# Patient Record
Sex: Male | Born: 1945 | Race: White | Hispanic: No | Marital: Married | Smoking: Never smoker
Health system: Southern US, Community
[De-identification: ages and names within clinical notes are randomized; demographics above are authoritative.]

## PROBLEM LIST (undated history)

## (undated) DIAGNOSIS — IMO0002 Reserved for concepts with insufficient information to code with codable children: Secondary | ICD-10-CM

## (undated) DIAGNOSIS — E119 Type 2 diabetes mellitus without complications: Secondary | ICD-10-CM

## (undated) DIAGNOSIS — E785 Hyperlipidemia, unspecified: Secondary | ICD-10-CM

## (undated) DIAGNOSIS — D099 Carcinoma in situ, unspecified: Secondary | ICD-10-CM

## (undated) DIAGNOSIS — T753XXA Motion sickness, initial encounter: Secondary | ICD-10-CM

## (undated) DIAGNOSIS — G4730 Sleep apnea, unspecified: Secondary | ICD-10-CM

## (undated) DIAGNOSIS — F419 Anxiety disorder, unspecified: Secondary | ICD-10-CM

## (undated) DIAGNOSIS — E1169 Type 2 diabetes mellitus with other specified complication: Secondary | ICD-10-CM

## (undated) DIAGNOSIS — I10 Essential (primary) hypertension: Secondary | ICD-10-CM

## (undated) DIAGNOSIS — K760 Fatty (change of) liver, not elsewhere classified: Secondary | ICD-10-CM

## (undated) DIAGNOSIS — G2581 Restless legs syndrome: Secondary | ICD-10-CM

## (undated) HISTORY — PX: ANTERIOR FUSION CERVICAL SPINE: SUR626

## (undated) HISTORY — PX: FOOT FRACTURE SURGERY: SHX645

## (undated) HISTORY — PX: FRACTURE SURGERY: SHX138

## (undated) HISTORY — PX: NASAL TURBINATE REDUCTION: SHX2072

## (undated) HISTORY — PX: TONSILLECTOMY: SUR1361

## (undated) HISTORY — PX: EYE SURGERY: SHX253

---

## 2003-07-27 HISTORY — PX: TRIGGER FINGER RELEASE: SHX641

## 2015-02-25 ENCOUNTER — Other Ambulatory Visit: Payer: Self-pay | Admitting: Orthopedic Surgery

## 2015-02-25 DIAGNOSIS — M25522 Pain in left elbow: Principal | ICD-10-CM

## 2015-02-25 DIAGNOSIS — G8929 Other chronic pain: Secondary | ICD-10-CM

## 2015-03-05 ENCOUNTER — Ambulatory Visit
Admission: RE | Admit: 2015-03-05 | Discharge: 2015-03-05 | Disposition: A | Discharge status: Home / Self Care | Payer: Medicare Other | Source: Ambulatory Visit | Attending: Orthopedic Surgery | Admitting: Orthopedic Surgery

## 2015-03-05 ENCOUNTER — Ambulatory Visit
Admission: EM | Admit: 2015-03-05 | Discharge: 2015-03-05 | Disposition: A | Discharge status: Home / Self Care | Payer: Medicare Other | Attending: Family Medicine | Admitting: Family Medicine

## 2015-03-05 DIAGNOSIS — M7032 Other bursitis of elbow, left elbow: Secondary | ICD-10-CM | POA: Diagnosis not present

## 2015-03-05 DIAGNOSIS — G8929 Other chronic pain: Secondary | ICD-10-CM

## 2015-03-05 DIAGNOSIS — M7712 Lateral epicondylitis, left elbow: Secondary | ICD-10-CM | POA: Insufficient documentation

## 2015-03-05 DIAGNOSIS — S81811A Laceration without foreign body, right lower leg, initial encounter: Secondary | ICD-10-CM

## 2015-03-05 DIAGNOSIS — M25522 Pain in left elbow: Secondary | ICD-10-CM

## 2015-03-05 HISTORY — DX: Sleep apnea, unspecified: G47.30

## 2015-03-05 MED ORDER — MUPIROCIN 2 % EX OINT: 1.0000 "application " | TOPICAL_OINTMENT | Freq: Three times a day (TID) | CUTANEOUS | Status: DC

## 2015-03-05 MED ORDER — LIDOCAINE-EPINEPHRINE-TETRACAINE (LET) SOLUTION
3.0000 mL | Freq: Once | NASAL | Status: AC
  Administered 2015-03-05: 3 mL via TOPICAL

## 2015-03-05 NOTE — Discharge Instructions (Signed)
Laceration Care, Adult A laceration is a cut that goes through all layers of the skin. The cut goes into the tissue beneath the skin. HOME CARE For stitches (sutures) or staples:  Keep the cut clean and dry.  If you have a bandage (dressing), change it at least once a day. Change the bandage if it gets wet or dirty, or as told by your doctor.  Wash the cut with soap and water 2 times a day. Rinse the cut with water. Pat it dry with a clean towel.  Put a thin layer of medicated cream on the cut as told by your doctor.  You may shower after the first 24 hours. Do not soak the cut in water until the stitches are removed.  Only take medicines as told by your doctor.  Have your stitches or staples removed as told by your doctor. For skin adhesive strips:  Keep the cut clean and dry.  Do not get the strips wet. You may take a bath, but be careful to keep the cut dry.  If the cut gets wet, pat it dry with a clean towel.  The strips will fall off on their own. Do not remove the strips that are still stuck to the cut. For wound glue:  You may shower or take baths. Do not soak or scrub the cut. Do not swim. Avoid heavy sweating until the glue falls off on its own. After a shower or bath, pat the cut dry with a clean towel.  Do not put medicine on your cut until the glue falls off.  If you have a bandage, do not put tape over the glue.  Avoid lots of sunlight or tanning lamps until the glue falls off. Put sunscreen on the cut for the first year to reduce your scar.  The glue will fall off on its own. Do not pick at the glue. You may need a tetanus shot if:  You cannot remember when you had your last tetanus shot.  You have never had a tetanus shot. If you need a tetanus shot and you choose not to have one, you may get tetanus. Sickness from tetanus can be serious. GET HELP RIGHT AWAY IF:   Your pain does not get better with medicine.  Your arm, hand, leg, or foot loses feeling  (numbness) or changes color.  Your cut is bleeding.  Your joint feels weak, or you cannot use your joint.  You have painful lumps on your body.  Your cut is red, puffy (swollen), or painful.  You have a red line on the skin near the cut.  You have yellowish-white fluid (pus) coming from the cut.  You have a fever.  You have a bad smell coming from the cut or bandage.  Your cut breaks open before or after stitches are removed.  You notice something coming out of the cut, such as wood or glass.  You cannot move a finger or toe. MAKE SURE YOU:   Understand these instructions.  Will watch your condition.  Will get help right away if you are not doing well or get worse. Document Released: 12/29/2007 Document Revised: 10/04/2011 Document Reviewed: 01/05/2011 [facility] Patient Information 2015 [employer], [place]. This information is not intended to replace advice given to you by your health care provider. Make sure you discuss any questions you have with your health care provider.  Wound Care Wound care helps prevent pain and infection.  You may need a tetanus shot if:  You cannot remember when you had your last tetanus shot.  You have never had a tetanus shot.  The injury broke your skin. If you need a tetanus shot and you choose not to have one, you may get tetanus. Sickness from tetanus can be serious. HOME CARE   Only take medicine as told by your doctor.  Clean the wound daily with mild soap and water.  Change any bandages (dressings) as told by your doctor.  Put medicated cream and a bandage on the wound as told by your doctor.  Change the bandage if it gets wet, dirty, or starts to smell.  Take showers. Do not take baths, swim, or do anything that puts your wound under water.  Rest and raise (elevate) the wound until the pain and puffiness (swelling) are better.  Keep all doctor visits as told. GET HELP RIGHT AWAY IF:   Yellowish-white fluid (pus) comes from  the wound.  Medicine does not lessen your pain.  There is a red streak going away from the wound.  You have a fever. MAKE SURE YOU:   Understand these instructions.  Will watch your condition.  Will get help right away if you are not doing well or get worse. Document Released: 04/20/2008 Document Revised: 10/04/2011 Document Reviewed: 11/15/2010 [facility] Patient Information 2015 [place], [place]. This information is not intended to replace advice given to you by your health care provider. Make sure you discuss any questions you have with your health care provider.

## 2015-03-05 NOTE — ED Notes (Signed)
States chain came off chainsaw and hit right anterior shin. Approximate 1 inch laceration with bleeding controlled.

## 2015-03-05 NOTE — ED Provider Notes (Signed)
CSN: [id number]     Arrival date & time 03/05/15  1244 History   First MD Initiated Contact with Patient 03/05/15 1323     Chief Complaint  Patient presents with  . Laceration   (Consider location/radiation/quality/duration/timing/severity/associated sxs/prior Treatment) Patient is a 69 y.o. male presenting with skin laceration. The history is provided by the patient. No language interpreter was used.  Laceration Location:  Leg Leg laceration location:  R lower leg Depth:  Through underlying tissue Quality: jagged   Bleeding: controlled with pressure   Time since incident:  30 minutes Injury mechanism: chain saw broke  as he was carrying it back and cut him. Pain details:    Quality:  Throbbing and sharp   Severity:  Moderate   Timing:  Constant   Progression:  Unchanged Foreign body present:  No foreign bodies Relieved by:  Pressure Tetanus status:  Up to date   Past Medical History  Diagnosis Date  . Diabetes mellitus without complication   . Sleep apnea    Past Surgical History  Procedure Laterality Date  . Back surgery     History reviewed. No pertinent family history. Social History  Substance Use Topics  . Smoking status: Never Smoker   . Smokeless tobacco: None  . Alcohol Use: No    Review of Systems  Constitutional: Negative for fever.  Musculoskeletal: Positive for myalgias.  Skin: Positive for wound.  All other systems reviewed and are negative.   Allergies  Review of patient's allergies indicates no known allergies.  Home Medications   Prior to Admission medications   Medication Sig Start Date End Date Taking? Authorizing Provider  aspirin 81 MG tablet Take 81 mg by mouth daily.   Yes Historical Provider, MD  atorvastatin (LIPITOR) 20 MG tablet Take 20 mg by mouth daily.   Yes Historical Provider, MD  clonazePAM (KLONOPIN) 1 MG tablet Take 1 mg by mouth 2 (two) times daily.   Yes Historical Provider, MD  FLUoxetine (PROZAC) 40 MG capsule Take 40  mg by mouth daily.   Yes Historical Provider, MD  lisinopril (PRINIVIL,ZESTRIL) 20 MG tablet Take 20 mg by mouth daily.   Yes Historical Provider, MD  pioglitazone (ACTOS) 30 MG tablet Take 30 mg by mouth daily.   Yes Historical Provider, MD  pramipexole (MIRAPEX) 1 MG tablet Take 1 mg by mouth 2 (two) times daily.   Yes Historical Provider, MD  sitaGLIPtin (JANUVIA) 25 MG tablet Take 25 mg by mouth 2 (two) times daily.   Yes Historical Provider, MD  mupirocin ointment (BACTROBAN) 2 % Apply 1 application topically 3 (three) times daily. 03/05/15   Hassan Rowan, MD   BP 91/52 mmHg  Pulse 79  Temp(Src) 98 F (36.7 C) (Tympanic)  Resp 16  Ht 5\' 9"  (1.753 m)  Wt 195 lb (88.451 kg)  BMI 28.78 kg/m2  SpO2 96% Physical Exam  Constitutional: He is oriented to person, place, and time. He appears well-developed and well-nourished.  HENT:  Head: Normocephalic.  Musculoskeletal: He exhibits tenderness. He exhibits no edema.       Right lower leg: He exhibits laceration.       Legs: 5cm jagged laceration on lower R leg  Neurological: He is alert and oriented to person, place, and time.  Skin: Skin is warm. No rash noted.  Psychiatric: He has a normal mood and affect. His behavior is normal.  Vitals reviewed.   ED Course  LACERATION REPAIR Date/Time: 03/05/2015 2:04 PM Performed by: Hassan Rowan  Authorized by: Hassan Rowan Consent: Verbal consent obtained. Written consent obtained. Consent given by: patient Patient understanding: patient states understanding of the procedure being performed Body area: lower extremity Location details: right lower leg Laceration length: 5 cm Contamination: The wound is contaminated. Foreign bodies: no foreign bodies Tendon involvement: none Nerve involvement: none Vascular damage: yes Local anesthetic: lidocaine 1% without epinephrine Anesthetic total (ml): 3. Amount of cleaning: standard Debridement: minimal Skin closure: staples Number of sutures:  5. Dressing: pressure dressing   (including critical care time) Labs Review Labs Reviewed - No data to display  Imaging Review Mr Elbow Left Wo Contrast  03/05/2015   CLINICAL DATA:  Constant pain and soreness in the left elbow. Symptoms are chronic. Initial encounter.  EXAM: MRI OF THE LEFT ELBOW WITHOUT CONTRAST  TECHNIQUE: Multiplanar, multisequence MR imaging of the elbow was performed. No intravenous contrast was administered.  COMPARISON:  None.  FINDINGS: TENDONS  Common forearm flexor origin: Intact.  Common forearm extensor origin: Intrasubstance increased T2 signal is seen in the tendons of the common extensor origin consistent with epicondylitis. No frank tear is identified.  Biceps: Prominent fluid is seen in the bicipitoradial bursa consistent with bursitis. The distal most biceps tendon appears slightly attenuated with mildly increased intrasubstance increased T2 signal consistent with tendinosis with without tear.  Triceps: Intact.  LIGAMENTS  Medial stabilizers: Intact.  Lateral stabilizers: Intrasubstance increased T2 signal is seen in the proximal fibers of the lateral ulnar collateral ligament consistent with sprain without tear. Lateral ligaments are otherwise intact.  Cartilage: Unremarkable.  Joint: No joint effusion.  Cubital tunnel: Appears normal.  Bones: Normal marrow signal throughout.  IMPRESSION: The study is positive for bicipitoradial bursitis. Tendinosis of the distal most biceps tendon without tear is also identified.  Lateral epicondylitis with associated sprain of the lateral ulnar collateral ligament without frank tear.   Electronically Signed   By: Drusilla Kanner M.D.   On: 03/05/2015 12:48     MDM   1. Laceration of leg excluding thigh, right, initial encounter     Patient tolerated repair well. Bactroban ointment ordered. Instructed to return in 14 days for staple removal.   Hassan Rowan, MD 03/05/15 1409

## 2015-03-18 ENCOUNTER — Ambulatory Visit
Admission: EM | Admit: 2015-03-18 | Discharge: 2015-03-18 | Disposition: A | Discharge status: Home / Self Care | Payer: Medicare Other

## 2015-03-18 NOTE — ED Notes (Signed)
Pt. Seen here at [facility] on 03/05/15 and had 5 sutures placed right lower anterior shin. Wound well healed. For staple removal

## 2015-03-18 NOTE — ED Notes (Signed)
5 staples removed without difficulty. Steri strips applied

## 2015-05-23 ENCOUNTER — Ambulatory Visit
Admission: RE | Admit: 2015-05-23 | Discharge: 2015-05-23 | Disposition: A | Discharge status: Home / Self Care | Payer: Medicare Other | Source: Ambulatory Visit | Attending: Unknown Physician Specialty | Admitting: Unknown Physician Specialty

## 2015-05-23 ENCOUNTER — Ambulatory Visit: Payer: Medicare Other | Admitting: Student in an Organized Health Care Education/Training Program

## 2015-05-23 ENCOUNTER — Encounter
Admission: RE | Disposition: A | Discharge status: Home / Self Care | Payer: Self-pay | Source: Ambulatory Visit | Attending: Unknown Physician Specialty

## 2015-05-23 ENCOUNTER — Encounter: Payer: Self-pay | Admitting: *Deleted

## 2015-05-23 DIAGNOSIS — Z79899 Other long term (current) drug therapy: Secondary | ICD-10-CM | POA: Insufficient documentation

## 2015-05-23 DIAGNOSIS — Z7982 Long term (current) use of aspirin: Secondary | ICD-10-CM | POA: Insufficient documentation

## 2015-05-23 DIAGNOSIS — M25722 Osteophyte, left elbow: Secondary | ICD-10-CM | POA: Insufficient documentation

## 2015-05-23 DIAGNOSIS — Z8249 Family history of ischemic heart disease and other diseases of the circulatory system: Secondary | ICD-10-CM | POA: Diagnosis not present

## 2015-05-23 DIAGNOSIS — E119 Type 2 diabetes mellitus without complications: Secondary | ICD-10-CM | POA: Insufficient documentation

## 2015-05-23 DIAGNOSIS — M7712 Lateral epicondylitis, left elbow: Secondary | ICD-10-CM | POA: Diagnosis not present

## 2015-05-23 DIAGNOSIS — G4730 Sleep apnea, unspecified: Secondary | ICD-10-CM | POA: Insufficient documentation

## 2015-05-23 DIAGNOSIS — F419 Anxiety disorder, unspecified: Secondary | ICD-10-CM | POA: Insufficient documentation

## 2015-05-23 DIAGNOSIS — G2581 Restless legs syndrome: Secondary | ICD-10-CM | POA: Insufficient documentation

## 2015-05-23 DIAGNOSIS — M1990 Unspecified osteoarthritis, unspecified site: Secondary | ICD-10-CM | POA: Insufficient documentation

## 2015-05-23 DIAGNOSIS — E785 Hyperlipidemia, unspecified: Secondary | ICD-10-CM | POA: Diagnosis not present

## 2015-05-23 HISTORY — DX: Anxiety disorder, unspecified: F41.9

## 2015-05-23 HISTORY — DX: Restless legs syndrome: G25.81

## 2015-05-23 HISTORY — DX: Motion sickness, initial encounter: T75.3XXA

## 2015-05-23 HISTORY — DX: Reserved for concepts with insufficient information to code with codable children: IMO0002

## 2015-05-23 HISTORY — DX: Hyperlipidemia, unspecified: E78.5

## 2015-05-23 HISTORY — PX: TENNIS ELBOW RELEASE/NIRSCHEL PROCEDURE: SHX6651

## 2015-05-23 LAB — GLUCOSE, CAPILLARY
GLUCOSE-CAPILLARY: 95 mg/dL (ref 65–99)
Glucose-Capillary: 120 mg/dL — ABNORMAL HIGH (ref 65–99)

## 2015-05-23 SURGERY — TENNIS ELBOW RELEASE/NIRSCHEL PROCEDURE
Anesthesia: Monitor Anesthesia Care | Laterality: Left | Wound class: Clean

## 2015-05-23 MED ORDER — LIDOCAINE HCL (CARDIAC) 20 MG/ML IV SOLN
INTRAVENOUS | Status: DC | PRN
  Administered 2015-05-23: 40 mg via INTRAVENOUS

## 2015-05-23 MED ORDER — LACTATED RINGERS IV SOLN
INTRAVENOUS | Status: DC
  Administered 2015-05-23: 08:00:00 via INTRAVENOUS

## 2015-05-23 MED ORDER — BUPIVACAINE HCL (PF) 0.5 % IJ SOLN
INTRAMUSCULAR | Status: DC | PRN
  Administered 2015-05-23: 10 mL

## 2015-05-23 MED ORDER — FENTANYL CITRATE (PF) 100 MCG/2ML IJ SOLN
25.0000 ug | INTRAMUSCULAR | Status: DC | PRN
Start: 2015-05-23 — End: 2015-05-23

## 2015-05-23 MED ORDER — ROPIVACAINE HCL 5 MG/ML IJ SOLN
INTRAMUSCULAR | Status: DC | PRN
  Administered 2015-05-23: 35 mL via PERINEURAL

## 2015-05-23 MED ORDER — PROPOFOL 500 MG/50ML IV EMUL
INTRAVENOUS | Status: DC | PRN
  Administered 2015-05-23: 50 ug/kg/min via INTRAVENOUS

## 2015-05-23 MED ORDER — FENTANYL CITRATE (PF) 100 MCG/2ML IJ SOLN
INTRAMUSCULAR | Status: DC | PRN
  Administered 2015-05-23: 50 ug via INTRAVENOUS

## 2015-05-23 MED ORDER — HYDROCODONE-ACETAMINOPHEN 5-325 MG PO TABS: 1.0000 | ORAL_TABLET | ORAL | Status: DC | PRN

## 2015-05-23 MED ORDER — MIDAZOLAM HCL 2 MG/2ML IJ SOLN
INTRAMUSCULAR | Status: DC | PRN
  Administered 2015-05-23: 2 mg via INTRAVENOUS

## 2015-05-23 MED ORDER — DEXAMETHASONE SODIUM PHOSPHATE 4 MG/ML IJ SOLN
INTRAMUSCULAR | Status: DC | PRN
  Administered 2015-05-23: 4 mg via PERINEURAL

## 2015-05-23 MED ORDER — ONDANSETRON HCL 4 MG/2ML IJ SOLN: 4.0000 mg | Freq: Once | INTRAMUSCULAR | Status: DC | PRN

## 2015-05-23 SURGICAL SUPPLY — 40 items
BANDAGE ELASTIC 4 CLIP ST LF (GAUZE/BANDAGES/DRESSINGS) ×4 IMPLANT
BIT DRILL 2.0 (BIT) ×1
BIT DRILL 2XNS DISP SS SM FRAG (BIT) ×1 IMPLANT
BIT DRL 2XNS DISP SS SM FRAG (BIT) ×1
BLADE SURG SZ10 CARB STEEL (BLADE) ×2 IMPLANT
BNDG COHESIVE 4X5 TAN STRL (GAUZE/BANDAGES/DRESSINGS) ×2 IMPLANT
BNDG ESMARK 4X12 TAN STRL LF (GAUZE/BANDAGES/DRESSINGS) ×2 IMPLANT
BUR RND CARBIDE 4.0 (BURR) ×2 IMPLANT
CANISTER SUCT 1200ML W/VALVE (MISCELLANEOUS) ×2 IMPLANT
CUFF TOURN SGL QUICK 18 (TOURNIQUET CUFF) ×2 IMPLANT
CUFF TOURN SGL QUICK 24 (TOURNIQUET CUFF)
CUFF TRNQT CYL 24X4X40X1 (TOURNIQUET CUFF) IMPLANT
DRAPE INCISE IOBAN 66X45 STRL (DRAPES) ×2 IMPLANT
DURAPREP 26ML APPLICATOR (WOUND CARE) ×2 IMPLANT
GAUZE SPONGE 4X4 12PLY STRL (GAUZE/BANDAGES/DRESSINGS) ×2 IMPLANT
GLOVE BIO SURGEON STRL SZ7.5 (GLOVE) ×2 IMPLANT
GLOVE BIO SURGEON STRL SZ8 (GLOVE) ×4 IMPLANT
GLOVE INDICATOR 8.0 STRL GRN (GLOVE) ×2 IMPLANT
GOWN STRL REUS W/ TWL LRG LVL3 (GOWN DISPOSABLE) ×2 IMPLANT
GOWN STRL REUS W/TWL LRG LVL3 (GOWN DISPOSABLE) ×2
KIT SHOULDER TRACTION (DRAPES) ×2 IMPLANT
NS IRRIG 500ML POUR BTL (IV SOLUTION) ×2 IMPLANT
PACK EXTREMITY ARMC (MISCELLANEOUS) ×2 IMPLANT
PAD CAST CTTN 4X4 STRL (SOFTGOODS) ×1 IMPLANT
PAD GROUND ADULT SPLIT (MISCELLANEOUS) ×2 IMPLANT
PADDING CAST COTTON 4X4 STRL (SOFTGOODS) ×1
SCRUB POVIDONE IODINE 4 OZ (MISCELLANEOUS) ×2 IMPLANT
SLING ARM LRG DEEP (SOFTGOODS) ×2 IMPLANT
SPLINT FAST PLASTER 5X30 (CAST SUPPLIES) ×1
SPLINT PLASTER CAST FAST 5X30 (CAST SUPPLIES) ×1 IMPLANT
SPONGE LAP 18X18 5 PK (GAUZE/BANDAGES/DRESSINGS) IMPLANT
STAPLER SKIN PROX 35W (STAPLE) IMPLANT
STOCKINETTE IMPERVIOUS LG (DRAPES) ×2 IMPLANT
STRAP BODY AND KNEE 60X3 (MISCELLANEOUS) ×2 IMPLANT
SUT VIC AB 0 CT1 27 (SUTURE)
SUT VIC AB 0 CT1 27XCR 8 STRN (SUTURE) IMPLANT
SUT VIC AB 4-0 SH 27 (SUTURE) ×1
SUT VIC AB 4-0 SH 27XANBCTRL (SUTURE) ×1 IMPLANT
SUT VICRYL+ 3-0 36IN CT-1 (SUTURE) ×4 IMPLANT
SYRINGE 10CC LL (SYRINGE) ×2 IMPLANT

## 2015-05-23 NOTE — Transfer of Care (Signed)
Immediate Anesthesia Transfer of Care Note  Patient: Alexander BreslowGary W Kreager  Procedure(s) Performed: Procedure(s) with comments: TENNIS ELBOW RELEASE/NIRSCHEL PROCEDURE (Left) - CPAP AND DIABETIC  Patient Location: PACU  Anesthesia Type: Regional, MAC  Level of Consciousness: awake, alert  and patient cooperative  Airway and Oxygen Therapy: Patient Spontanous Breathing and Patient connected to supplemental oxygen  Post-op Assessment: Post-op Vital signs reviewed, Patient's Cardiovascular Status Stable, Respiratory Function Stable, Patent Airway and No signs of Nausea or vomiting  Post-op Vital Signs: Reviewed and stable  Complications: No apparent anesthesia complications

## 2015-05-23 NOTE — Op Note (Signed)
05/23/2015  12:03 PM  PATIENT:  Alexander BreslowGary W Hart  69 y.o. 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 He been refractory to injection of the left lateral epicondyle with steroid and anesthetic. An MRI was obtained which revealed disruption of the extensor carpi radialis brevis attachment to the left lateral epicondyle. Patient was ultimately scheduled for a Nirschl procedure on his left lateral epicondyle.  OP NOTE: The patient had a supraclavicular block in the preop area. He was then taken to the operating room where tourniquet was applied on his left upper extremity. The left upper extremity was then prepped and draped in usual fashion for procedure about the elbow. The left upper extremity was then exsanguinated and the tourniquet inflated. About a 1-1/2 inch longitudinal incision was made over the left lateral epicondyle and radial head. Dissection was carried down through subcutaneous tissue onto the interval between the common extensor and the extensor radialis longus. This was divided exposing the radio-humeral joint. I then reflected the common extensor attachment off of the lateral epicondyle. Inspection of the radio- humeral joint failed to reveal any intra-articular abnormality. I did go ahead and resect the remaining fibrous attachment of the extensor carpi radialis brevis. This was resected in a V shaped fashion. I then used a small round power bur to lightly decorticate the lateral epicondyle. There were several osteophytes that were removed at this time. I then used a 2 mm drill to  drill multiple vent holes in the lateral epicondyle  I then irrigated the wound with saline. The i longitudinal incision in the extensor mechanism was then repaired using 2-0 Vicryl sutures. The tourniquet was released at this time. It was up for 24 minutes. There was minimal bleeding. The subcutaneous tissue was closed with 3-0 Vicryl and the skin with skin staples. I did inject the wound with about 10 cc of half percent Marcaine without epinephrine. A sterile dressing was applied along with a fiberglass posterior splint with the elbow flexed about 90. A sling was applied to patient's left upper extremity. The patient was then transferred to a stretcher bed and taken to the recovery room in satisfactory condition.

## 2015-05-23 NOTE — Discharge Instructions (Signed)
General Anesthesia, Adult, Care After Refer to this sheet in the next few weeks. These instructions provide you with information on caring for yourself after your procedure. Your health care provider may also give you more specific instructions. Your treatment has been planned according to current medical practices, but problems sometimes occur. Call your health care provider if you have any problems or questions after your procedure. WHAT TO EXPECT AFTER THE PROCEDURE After the procedure, it is typical to experience:  Sleepiness.  Nausea and vomiting. HOME CARE INSTRUCTIONS  For the first 24 hours after general anesthesia:  Have a responsible person with you.  Do not drive a car. If you are alone, do not take public transportation.  Do not drink alcohol.  Do not take medicine that has not been prescribed by your health care provider.  Do not sign important papers or make important decisions.  You may resume a normal diet and activities as directed by your health care provider.  Change bandages (dressings) as directed.  If you have questions or problems that seem related to general anesthesia, call the hospital and ask for the anesthetist or anesthesiologist on call. SEEK MEDICAL CARE IF:  You have nausea and vomiting that continue the day after anesthesia.  You develop a rash. SEEK IMMEDIATE MEDICAL CARE IF:   You have difficulty breathing.  You have chest pain.  You have any allergic problems.   This information is not intended to replace advice given to you by your health care provider. Make sure you discuss any questions you have with your health care provider.   Document Released: 10/18/2000 Document Revised: 08/02/2014 Document Reviewed: 11/10/2011 Elsevier Interactive Patient Education 2016 [employer]  Elevation  Ice pack  RTC in about 10 days  Use sling as needed

## 2015-05-23 NOTE — Anesthesia Procedure Notes (Addendum)
Anesthesia Regional Block:  Supraclavicular block  Pre-Anesthetic Checklist: ,, timeout performed, Correct Patient, Correct Site, Correct Laterality, Correct Procedure, Correct Position, site marked, Risks and benefits discussed,  Surgical consent,  Pre-op evaluation,  At surgeon's request and post-op pain management  Laterality: Left  Prep: chloraprep       Needles:  Injection technique: Single-shot  Needle Type: Echogenic Stimulator Needle      Needle Gauge: 21 and 21 G    Additional Needles:  Procedures: ultrasound guided (picture in chart) and nerve stimulator Supraclavicular block  Nerve Stimulator or Paresthesia:  Response: bicep contraction, 0.45 mA,   Additional Responses:   Narrative:  Start time: 05/23/2015 8:33 AM End time: 05/23/2015 8:38 AM Injection made incrementally with aspirations every 5 mL.  Performed by: Personally  Anesthesiologist: Ranee GosselinSTELLA, MICHAEL  Additional Notes: Functioning IV was confirmed and monitors applied.  Sterile prep and drape,hand hygiene and sterile gloves were used.Ultrasound guidance: relevant anatomy identified, needle position confirmed, local anesthetic spread visualized around nerve(s)., vascular puncture avoided.  Image printed for medical record.  Negative aspiration and negative test dose prior to incremental administration of local anesthetic. The patient tolerated the procedure well. Vitals signes recorded in RN notes.   Procedure Name: MAC Performed by: Andee PolesBUSH, Joi Leyva Pre-anesthesia Checklist: Patient identified, Emergency Drugs available, Suction available, Timeout performed and Patient being monitored Patient Re-evaluated:Patient Re-evaluated prior to inductionOxygen Delivery Method: Nasal cannula Placement Confirmation: positive ETCO2

## 2015-05-23 NOTE — H&P (Signed)
  H and P reviewed. No changes. Uploaded at later date. 

## 2015-05-23 NOTE — Anesthesia Preprocedure Evaluation (Signed)
Anesthesia Evaluation  Patient identified by MRN, date of birth, ID band  Reviewed: Allergy & Precautions, H&P , NPO status , Patient's Chart, lab work & pertinent test results  Airway Mallampati: II  TM Distance: >3 FB Neck ROM: full    Dental no notable dental hx.    Pulmonary sleep apnea and Continuous Positive Airway Pressure Ventilation ,    Pulmonary exam normal        Cardiovascular  Rhythm:regular Rate:Normal     Neuro/Psych    GI/Hepatic   Endo/Other  diabetes, Type 2, Oral Hypoglycemic Agents  Renal/GU      Musculoskeletal   Abdominal   Peds  Hematology   Anesthesia Other Findings S/p uvpp  Reproductive/Obstetrics                             Anesthesia Physical Anesthesia Plan  ASA: II  Anesthesia Plan: Regional and MAC   Post-op Pain Management:    Induction:   Airway Management Planned:   Additional Equipment:   Intra-op Plan:   Post-operative Plan:   Informed Consent: I have reviewed the patients History and Physical, chart, labs and discussed the procedure including the risks, benefits and alternatives for the proposed anesthesia with the patient or authorized representative who has indicated his/her understanding and acceptance.     Plan Discussed with: CRNA  Anesthesia Plan Comments:         Anesthesia Quick Evaluation

## 2015-05-23 NOTE — Anesthesia Postprocedure Evaluation (Signed)
  Anesthesia Post-op Note  Patient: Alexander Hart  Procedure(s) Performed: Procedure(s) with comments: TENNIS ELBOW RELEASE/NIRSCHEL PROCEDURE (Left) - CPAP AND DIABETIC  Anesthesia type:Regional, MAC  Patient location: PACU  Post pain: Pain level controlled  Post assessment: Post-op Vital signs reviewed, Patient's Cardiovascular Status Stable, Respiratory Function Stable, Patent Airway and No signs of Nausea or vomiting  Post vital signs: Reviewed and stable  Last Vitals:  Filed Vitals:   05/23/15 1050  BP:   Pulse: 51  Temp:   Resp: 15    Level of consciousness: awake, alert  and patient cooperative  Complications: No apparent anesthesia complications

## 2015-05-23 NOTE — Progress Notes (Signed)
Assisted Mike Stella ANMD with left, ultrasound guided, supraclavicular block. Side rails up, monitors on throughout procedure. See vital signs in flow sheet. Tolerated Procedure well.  

## 2015-05-26 ENCOUNTER — Encounter: Payer: Self-pay | Admitting: Unknown Physician Specialty

## 2015-08-06 ENCOUNTER — Ambulatory Visit
Admission: EM | Admit: 2015-08-06 | Discharge: 2015-08-06 | Disposition: A | Discharge status: Other Acute Inpatient Hospital | Payer: Medicare Other | Attending: Family Medicine | Admitting: Family Medicine

## 2015-08-06 DIAGNOSIS — M20012 Mallet finger of left finger(s): Secondary | ICD-10-CM | POA: Diagnosis not present

## 2015-08-06 DIAGNOSIS — S6992XA Unspecified injury of left wrist, hand and finger(s), initial encounter: Secondary | ICD-10-CM

## 2015-08-06 NOTE — ED Provider Notes (Addendum)
Patient presents today after cutting his left digits with a table saw. Patient states that it happened about 10 or 15 minutes ago. Patient has injury to his left first and second and third digits. He denies any other injury anywhere else. Patient denies being on any other blood thinners besides a baby aspirin daily.  ROS: Negative except mentioned above.  GENERAL: NAD MSK: L Hand-  Patient has small distal amputations of the skin/tissue of the first and second digits that are bleeding, third digit appears to have a laceration on the dorsal aspect of the DIP area, patient is unable to extend fully at this area, neurovascularly intact   A/P: Left finger injury- given inability to extend the digit at the DIP with a laceration there would recommend that patient go to the ER. X-rays and ortho referral will be needed. Patient states that he will go there on his own now than her stated dress the area prior to the patient being discharged.   Jolene ProvostKirtida Makenzey Nanni, MD 08/06/15 [phone]  Jolene ProvostKirtida Kristofor Michalowski, MD 08/06/15 [phone]

## 2015-08-06 NOTE — ED Notes (Signed)
Patient states he cut his left thumb , and index finger with a table saw

## 2016-01-06 ENCOUNTER — Encounter: Payer: Self-pay | Admitting: Emergency Medicine

## 2016-01-06 ENCOUNTER — Ambulatory Visit
Admission: EM | Admit: 2016-01-06 | Discharge: 2016-01-06 | Disposition: A | Discharge status: Home / Self Care | Payer: Medicare Other | Attending: Internal Medicine | Admitting: Internal Medicine

## 2016-01-06 DIAGNOSIS — J209 Acute bronchitis, unspecified: Secondary | ICD-10-CM

## 2016-01-06 DIAGNOSIS — H6692 Otitis media, unspecified, left ear: Secondary | ICD-10-CM | POA: Diagnosis not present

## 2016-01-06 DIAGNOSIS — J208 Acute bronchitis due to other specified organisms: Secondary | ICD-10-CM

## 2016-01-06 MED ORDER — ALBUTEROL SULFATE HFA 108 (90 BASE) MCG/ACT IN AERS: 1.0000 | INHALATION_SPRAY | Freq: Four times a day (QID) | RESPIRATORY_TRACT | Status: DC | PRN

## 2016-01-06 MED ORDER — IPRATROPIUM-ALBUTEROL 0.5-2.5 (3) MG/3ML IN SOLN
3.0000 mL | Freq: Once | RESPIRATORY_TRACT | Status: AC
  Administered 2016-01-06: 3 mL via RESPIRATORY_TRACT

## 2016-01-06 MED ORDER — AMOXICILLIN-POT CLAVULANATE 875-125 MG PO TABS: 1.0000 | ORAL_TABLET | Freq: Two times a day (BID) | ORAL | Status: DC

## 2016-01-06 NOTE — ED Notes (Signed)
Patient states he feels bad all over and has a bad cough that started last Friday

## 2016-01-06 NOTE — Discharge Instructions (Signed)
Anticipate gradual improvement over the next several days; cough may linger for a couple weeks. Recheck or followup pcp/Dr Jannet AskewSantiana for increasing phlegm production, new fever >100.5, or if not starting to improve in a few days.

## 2016-01-06 NOTE — ED Provider Notes (Signed)
CSN: [id number]     Arrival date & time 01/06/16  1313 History   None    Chief Complaint  Patient presents with  . Cough   HPI  70 year old patient who presents today with 3 day history of malaise, fatigue, head and chest congestion. Lots of sinus drainage. No fever, some headache. Particularly, had right orbital pressure and headache this morning, that has improved after being up for several hours.  Cough is productive of scant phlegm, wheezy cough, feels like a little more work to take a deep breath. Coughing spells. At onset of symptoms, he had several episodes of emesis, and one episode of diarrhea. Still nauseous, poor appetite. Not eating well. No abdominal pain No fever. No sore throat. No rash. Did remove a tick from himself about a week ago, it was flat and small, not engorged.  Had some lightheadedness yesterday, tried to do some work in his shop, and was too dizzy. Feels a little less dizzy today.  Past Medical History  Diagnosis Date  . Diabetes mellitus without complication (HCC)     TYPE 2  . Sleep apnea     S/P T&A AND SINUS SURGERY, USES CPAP  . Hyperlipidemia   . Restless leg syndrome   . Anxiety     CONTROLLED ON PROZAC  . Epicondylitis syndrome of elbow     LEFT, ARTHRITIS ANKLES,SHOULDERS,KNEES  . Motion sickness     CAR   Past Surgical History  Procedure Laterality Date  . Foot fracture surgery Left   . Trigger finger release Bilateral 2005  . Nasal turbinate reduction    . Tonsillectomy    . Anterior fusion cervical spine      C5-6  . Tennis elbow release/nirschel procedure Left 05/23/2015    Procedure: TENNIS ELBOW RELEASE/NIRSCHEL PROCEDURE;  Surgeon: Erin SonsHarold Kernodle, MD;  Location[facility];  Service: Orthopedics;  Laterality: Left;  CPAP AND DIABETIC   History reviewed. No pertinent family history. Social History  Substance Use Topics  . Smoking status: Never Smoker   . Smokeless tobacco: Never Used  . Alcohol Use: Yes     Comment: 1/  MONTH WINE    Review of Systems  All other systems reviewed and are negative.   Allergies  Review of patient's allergies indicates no known allergies.  Home Medications   Prior to Admission medications   Medication Sig Start Date End Date Taking? Authorizing Provider  aspirin 81 MG tablet Take 81 mg by mouth daily. AM   Yes Historical Provider, MD  atorvastatin (LIPITOR) 20 MG tablet Take 20 mg by mouth daily. AM   Yes Historical Provider, MD  FLUoxetine (PROZAC) 40 MG capsule Take 40 mg by mouth daily. AM   Yes Historical Provider, MD  Linagliptin-Metformin HCl 2.11-998 MG TABS Take by mouth 2 (two) times daily. AM AND PM   Yes Historical Provider, MD  lisinopril (PRINIVIL,ZESTRIL) 20 MG tablet Take 20 mg by mouth daily.   Yes Historical Provider, MD  pioglitazone (ACTOS) 30 MG tablet Take 30 mg by mouth daily. AM   Yes Historical Provider, MD  pramipexole (MIRAPEX) 1 MG tablet Take 2 mg by mouth daily. PM   Yes Historical Provider, MD  clonazePAM (KLONOPIN) 1 MG tablet Take 1 mg by mouth as needed.     Historical Provider, MD   Meds Ordered and Administered this Visit   Medications  ipratropium-albuterol (DUONEB) 0.5-2.5 (3) MG/3ML nebulizer solution 3 mL (3 mLs Nebulization Given 01/06/16 1438)  BP 95/58 mmHg  Pulse 76  Temp(Src) 97.6 F (36.4 C)  Resp 18  Wt 195 lb (88.451 kg)  SpO2 98% Orthostatic VS for the past 24 hrs:  BP- Lying Pulse- Lying BP- Sitting Pulse- Sitting BP- Standing at 0 minutes Pulse- Standing at 0 minutes  01/06/16 1453 100/59 mmHg 75 101/61 mmHg 77 93/56 mmHg 85   Physical Exam  Constitutional: He is oriented to person, place, and time. No distress.  Alert, nicely groomed Sitting up in chair. Looks tired.  HENT:  Head: Atraumatic.  Bilateral TMs dull, left is opaque and red. Moderate nasal congestion Throat is pink, scant postnasal drainage, oral mucosa is dry  Eyes:  Conjugate gaze, no eye redness/drainage  Neck: Neck supple.   Cardiovascular: Normal rate and regular rhythm.   Pulmonary/Chest: No respiratory distress. He has no wheezes. He has no rales.  Coarse and somewhat diminished but symmetric breath sounds throughout  Abdominal: He exhibits no distension.  Musculoskeletal: Normal range of motion. He exhibits no edema.  Neurological: He is alert and oriented to person, place, and time.  Skin: Skin is warm and dry. No rash noted. No pallor.  Tanned. No cyanosis  Nursing note and vitals reviewed.   ED Course  Procedures (including critical care time)   Dr Terrance [facility]    MDM   1. Infective left otitis media   2. Acute bronchitis due to infection    Meds ordered this encounter  Medications  . amoxicillin-clavulanate (AUGMENTIN) 875-125 MG tablet    Sig: Take 1 tablet by mouth every 12 (twelve) hours.    Dispense:  14 tablet    Refill:  0  . albuterol (PROVENTIL HFA;VENTOLIN HFA) 108 (90 Base) MCG/ACT inhaler    Sig: Inhale 1-2 puffs into the lungs every 6 (six) hours as needed for wheezing or shortness of breath.    Dispense:  1 Inhaler    Refill:  0   Anticipate gradual improvement over the next several days; cough may linger for a couple weeks. Recheck or followup pcp/Dr Jannet Askew for increasing phlegm production, new fever >100.5, or if not starting to improve in a few days    Eustace Moore, MD 01/14/16 1655

## 2016-01-19 ENCOUNTER — Ambulatory Visit: Payer: Medicare Other

## 2016-01-19 ENCOUNTER — Ambulatory Visit
Admission: EM | Admit: 2016-01-19 | Discharge: 2016-01-19 | Disposition: A | Discharge status: Home / Self Care | Payer: Medicare Other | Attending: Family Medicine | Admitting: Family Medicine

## 2016-01-19 ENCOUNTER — Encounter: Payer: Self-pay | Admitting: *Deleted

## 2016-01-19 DIAGNOSIS — E785 Hyperlipidemia, unspecified: Secondary | ICD-10-CM | POA: Diagnosis not present

## 2016-01-19 DIAGNOSIS — M1990 Unspecified osteoarthritis, unspecified site: Secondary | ICD-10-CM | POA: Diagnosis not present

## 2016-01-19 DIAGNOSIS — E119 Type 2 diabetes mellitus without complications: Secondary | ICD-10-CM | POA: Diagnosis not present

## 2016-01-19 DIAGNOSIS — Z79899 Other long term (current) drug therapy: Secondary | ICD-10-CM | POA: Diagnosis not present

## 2016-01-19 DIAGNOSIS — G2581 Restless legs syndrome: Secondary | ICD-10-CM | POA: Diagnosis not present

## 2016-01-19 DIAGNOSIS — J40 Bronchitis, not specified as acute or chronic: Secondary | ICD-10-CM | POA: Diagnosis not present

## 2016-01-19 DIAGNOSIS — Z7982 Long term (current) use of aspirin: Secondary | ICD-10-CM | POA: Insufficient documentation

## 2016-01-19 DIAGNOSIS — F419 Anxiety disorder, unspecified: Secondary | ICD-10-CM | POA: Insufficient documentation

## 2016-01-19 DIAGNOSIS — R05 Cough: Secondary | ICD-10-CM | POA: Diagnosis present

## 2016-01-19 DIAGNOSIS — R51 Headache: Secondary | ICD-10-CM | POA: Diagnosis present

## 2016-01-19 DIAGNOSIS — G4730 Sleep apnea, unspecified: Secondary | ICD-10-CM | POA: Insufficient documentation

## 2016-01-19 DIAGNOSIS — R0981 Nasal congestion: Secondary | ICD-10-CM | POA: Diagnosis present

## 2016-01-19 MED ORDER — DOXYCYCLINE HYCLATE 100 MG PO TABS: 100.0000 mg | ORAL_TABLET | Freq: Two times a day (BID) | ORAL | Status: DC

## 2016-01-19 MED ORDER — ALBUTEROL SULFATE HFA 108 (90 BASE) MCG/ACT IN AERS: 1.0000 | INHALATION_SPRAY | Freq: Four times a day (QID) | RESPIRATORY_TRACT | Status: DC | PRN

## 2016-01-19 MED ORDER — PREDNISONE 20 MG PO TABS: ORAL_TABLET | ORAL | Status: DC

## 2016-01-19 MED ORDER — IPRATROPIUM-ALBUTEROL 0.5-2.5 (3) MG/3ML IN SOLN
3.0000 mL | Freq: Once | RESPIRATORY_TRACT | Status: AC
  Administered 2016-01-19: 3 mL via RESPIRATORY_TRACT

## 2016-01-19 NOTE — Discharge Instructions (Signed)
Upper Respiratory Infection, Adult Most upper respiratory infections (URIs) are a viral infection of the air passages leading to the lungs. A URI affects the nose, throat, and upper air passages. The most common type of URI is nasopharyngitis and is typically referred to as "the common cold." URIs run their course and usually go away on their own. Most of the time, a URI does not require medical attention, but sometimes a bacterial infection in the upper airways can follow a viral infection. This is called a secondary infection. Sinus and middle ear infections are common types of secondary upper respiratory infections. Bacterial pneumonia can also complicate a URI. A URI can worsen asthma and chronic obstructive pulmonary disease (COPD). Sometimes, these complications can require emergency medical care and may be life threatening.  CAUSES Almost all URIs are caused by viruses. A virus is a type of germ and can spread from one person to another.  RISKS FACTORS You may be at risk for a URI if:   You smoke.   You have chronic heart or lung disease.  You have a weakened defense (immune) system.   You are very young or very old.   You have nasal allergies or asthma.  You work in crowded or poorly ventilated areas.  You work in health care facilities or schools. SIGNS AND SYMPTOMS  Symptoms typically develop 2-3 days after you come in contact with a cold virus. Most viral URIs last 7-10 days. However, viral URIs from the influenza virus (flu virus) can last 14-18 days and are typically more severe. Symptoms may include:   Runny or stuffy (congested) nose.   Sneezing.   Cough.   Sore throat.   Headache.   Fatigue.   Fever.   Loss of appetite.   Pain in your forehead, behind your eyes, and over your cheekbones (sinus pain).  Muscle aches.  DIAGNOSIS  Your health care provider may diagnose a URI by:  Physical exam.  Tests to check that your symptoms are not due to  another condition such as:  Strep throat.  Sinusitis.  Pneumonia.  Asthma. TREATMENT  A URI goes away on its own with time. It cannot be cured with medicines, but medicines may be prescribed or recommended to relieve symptoms. Medicines may help:  Reduce your fever.  Reduce your cough.  Relieve nasal congestion. HOME CARE INSTRUCTIONS   Take medicines only as directed by your health care provider.   Gargle warm saltwater or take cough drops to comfort your throat as directed by your health care provider.  Use a warm mist humidifier or inhale steam from a shower to increase air moisture. This may make it easier to breathe.  Drink enough fluid to keep your urine clear or pale yellow.   Eat soups and other clear broths and maintain good nutrition.   Rest as needed.   Return to work when your temperature has returned to normal or as your health care provider advises. You may need to stay home longer to avoid infecting others. You can also use a face mask and careful hand washing to prevent spread of the virus.  Increase the usage of your inhaler if you have asthma.   Do not use any tobacco products, including cigarettes, chewing tobacco, or electronic cigarettes. If you need help quitting, ask your health care provider. PREVENTION  The best way to protect yourself from getting a cold is to practice good hygiene.   Avoid oral or hand contact with people with cold   symptoms.   Wash your hands often if contact occurs.  There is no clear evidence that vitamin C, vitamin E, echinacea, or exercise reduces the chance of developing a cold. However, it is always recommended to get plenty of rest, exercise, and practice good nutrition.  SEEK MEDICAL CARE IF:   You are getting worse rather than better.   Your symptoms are not controlled by medicine.   You have chills.  You have worsening shortness of breath.  You have brown or red mucus.  You have yellow or brown nasal  discharge.  You have pain in your face, especially when you bend forward.  You have a fever.  You have swollen neck glands.  You have pain while swallowing.  You have white areas in the back of your throat. SEEK IMMEDIATE MEDICAL CARE IF:   You have severe or persistent:  Headache.  Ear pain.  Sinus pain.  Chest pain.  You have chronic lung disease and any of the following:  Wheezing.  Prolonged cough.  Coughing up blood.  A change in your usual mucus.  You have a stiff neck.  You have changes in your:  Vision.  Hearing.  Thinking.  Mood. MAKE SURE YOU:   Understand these instructions.  Will watch your condition.  Will get help right away if you are not doing well or get worse.   This information is not intended to replace advice given to you by your health care provider. Make sure you discuss any questions you have with your health care provider.   Document Released: 01/05/2001 Document Revised: 11/26/2014 Document Reviewed: 10/17/2013 Elsevier Interactive Patient Education 2016 [employer]  

## 2016-01-19 NOTE — ED Notes (Signed)
Seen here 2 weeks ago and dx with bronchitis. Pt states while on the antibiotics rx at last visit he seemed to be getting better but when they stopped symptoms have returned. Here with cough, headache and runny nose/head congestion.

## 2016-02-20 NOTE — ED Provider Notes (Addendum)
[facility]    CSN: [id number] Arrival date & time: 01/19/16  1629  First Provider Contact:  First MD Initiated Contact with Patient 01/19/16 1842        History   Chief Complaint Chief Complaint  Patient presents with  . Cough  . Nasal Congestion  . Headache    HPI Alexander Hart is a 70 y.o. male.   The history is provided by the patient.  Cough  Cough characteristics:  Productive Sputum characteristics:  Nondescript Severity:  Moderate Onset quality:  Sudden Duration:  1 week Timing:  Constant Progression:  Worsening Chronicity:  New Smoker: no   Context: sick contacts and upper respiratory infection   Relieved by:  Nothing Ineffective treatments:  Fluids and rest Associated symptoms: fever and headaches   Associated symptoms: no chest pain and no eye discharge   Headache  Associated symptoms: cough and fever     Past Medical History:  Diagnosis Date  . Anxiety    CONTROLLED ON PROZAC  . Diabetes mellitus without complication (HCC)    TYPE 2  . Epicondylitis syndrome of elbow    LEFT, ARTHRITIS ANKLES,SHOULDERS,KNEES  . Hyperlipidemia   . Motion sickness    CAR  . Restless leg syndrome   . Sleep apnea    S/P T&A AND SINUS SURGERY, USES CPAP    There are no active problems to display for this patient.   Past Surgical History:  Procedure Laterality Date  . ANTERIOR FUSION CERVICAL SPINE     C5-6  . FOOT FRACTURE SURGERY Left   . NASAL TURBINATE REDUCTION    . TENNIS ELBOW RELEASE/NIRSCHEL PROCEDURE Left 05/23/2015   Procedure: TENNIS ELBOW RELEASE/NIRSCHEL PROCEDURE;  Surgeon: Erin Sons, MD;  Location: [facility];  Service: Orthopedics;  Laterality: Left;  CPAP AND DIABETIC  . TONSILLECTOMY    . TRIGGER FINGER RELEASE Bilateral 2005       Home Medications    Prior to Admission medications   Medication Sig Start Date End Date Taking? Authorizing Provider  aspirin 81 MG tablet Take 81 mg by mouth daily. AM    Yes Historical Provider, MD  atorvastatin (LIPITOR) 20 MG tablet Take 20 mg by mouth daily. AM   Yes Historical Provider, MD  clonazePAM (KLONOPIN) 1 MG tablet Take 1 mg by mouth as needed.    Yes Historical Provider, MD  FLUoxetine (PROZAC) 40 MG capsule Take 40 mg by mouth daily. AM   Yes Historical Provider, MD  Linagliptin-Metformin HCl 2.11-998 MG TABS Take by mouth 2 (two) times daily. AM AND PM   Yes Historical Provider, MD  lisinopril (PRINIVIL,ZESTRIL) 20 MG tablet Take 20 mg by mouth daily.   Yes Historical Provider, MD  pioglitazone (ACTOS) 30 MG tablet Take 30 mg by mouth daily. AM   Yes Historical Provider, MD  pramipexole (MIRAPEX) 1 MG tablet Take 2 mg by mouth daily. PM   Yes Historical Provider, MD  albuterol (PROVENTIL HFA;VENTOLIN HFA) 108 (90 Base) MCG/ACT inhaler Inhale 1-2 puffs into the lungs every 6 (six) hours as needed for wheezing or shortness of breath. 01/19/16   [name], MD  amoxicillin-clavulanate (AUGMENTIN) 875-125 MG tablet Take 1 tablet by mouth every 12 (twelve) hours. 01/06/16   Eustace Moore, MD  doxycycline (VIBRA-TABS) 100 MG tablet Take 1 tablet (100 mg total) by mouth 2 (two) times daily. 01/19/16   [name], MD  predniSONE (DELTASONE) 20 MG tablet 2 tabs po qd for 5 days 01/19/16  [name], MD    Family History History reviewed. No pertinent family history.  Social History Social History  Substance Use Topics  . Smoking status: Never Smoker  . Smokeless tobacco: Never Used  . Alcohol use Yes     Comment: 1/ MONTH WINE     Allergies   Review of patient's allergies indicates no known allergies.   Review of Systems Review of Systems  Constitutional: Positive for fever.  Eyes: Negative for discharge.  Respiratory: Positive for cough.   Cardiovascular: Negative for chest pain.  Neurological: Positive for headaches.     Physical Exam Triage Vital Signs ED Triage Vitals  Enc Vitals Group     BP 01/19/16 1826 115/69      Pulse Rate 01/19/16 1826 70     Resp 01/19/16 1826 16     Temp 01/19/16 1826 98.1 F (36.7 C)     Temp Source 01/19/16 1826 Oral     SpO2 01/19/16 1826 98 %     Weight 01/19/16 1826 195 lb (88.5 kg)     Height 01/19/16 1826 5\' 9"  (1.753 m)     Head Circumference --      Peak Flow --      Pain Score 01/19/16 1929 0     Pain Loc --      Pain Edu? --      Excl. in GC? --    No data found.   Updated Vital Signs BP 115/69 (BP Location: Left Arm)   Pulse 70   Temp 98.1 F (36.7 C) (Oral)   Resp 16   Ht 5\' 9"  (1.753 m)   Wt 195 lb (88.5 kg)   SpO2 98%   BMI 28.80 kg/m   Visual Acuity Right Eye Distance:   Left Eye Distance:   Bilateral Distance:    Right Eye Near:   Left Eye Near:    Bilateral Near:     Physical Exam  Constitutional: He appears well-developed and well-nourished. No distress.  HENT:  Head: Normocephalic and atraumatic.  Right Ear: Tympanic membrane, external ear and ear canal normal.  Left Ear: Tympanic membrane, external ear and ear canal normal.  Nose: Nose normal.  Mouth/Throat: Uvula is midline, oropharynx is clear and moist and mucous membranes are normal. No oropharyngeal exudate or tonsillar abscesses.  Eyes: Conjunctivae and EOM are normal. Pupils are equal, round, and reactive to light. Right eye exhibits no discharge. Left eye exhibits no discharge. No scleral icterus.  Neck: Normal range of motion. Neck supple. No tracheal deviation present. No thyromegaly present.  Cardiovascular: Normal rate, regular rhythm and normal heart sounds.   Pulmonary/Chest: Effort normal. No stridor. No respiratory distress. He has wheezes. He has rales. He exhibits no tenderness.  Lymphadenopathy:    He has no cervical adenopathy.  Neurological: He is alert.  Skin: Skin is warm and dry. No rash noted. He is not diaphoretic.  Nursing note and vitals reviewed.    UC Treatments / Results  Labs (all labs ordered are listed, but only abnormal results are  displayed) Labs Reviewed - No data to display  EKG  EKG Interpretation None       Radiology No results found.  Procedures Procedures (including critical care time)  Medications Ordered in UC Medications  ipratropium-albuterol (DUONEB) 0.5-2.5 (3) MG/3ML nebulizer solution 3 mL (3 mLs Nebulization Given 01/19/16 1850)     Initial Impression / Assessment and Plan / UC Course  I have reviewed the triage vital signs and  the nursing notes.  Pertinent labs & imaging results that were available during my care of the patient were reviewed by me and considered in my medical decision making (see chart for details).  Clinical Course      Final Clinical Impressions(s) / UC Diagnoses   Final diagnoses:  Bronchitis    New Prescriptions Discharge Medication List as of 01/19/2016  7:26 PM    START taking these medications   Details  doxycycline (VIBRA-TABS) 100 MG tablet Take 1 tablet (100 mg total) by mouth 2 (two) times daily., Starting 01/19/2016, Until Discontinued, Normal    predniSONE (DELTASONE) 20 MG tablet 2 tabs po qd for 5 days, Normal       1.  diagnosis reviewed with patient; patient given duoneb tx with improvement 2. rx as per orders above; reviewed possible side effects, interactions, risks and benefits; continue with home inhaler  3. Follow-up prn if symptoms worsen or don't improve   [name], MD 02/20/16 1933    [name], MD 02/20/16 [phone]

## 2016-03-02 ENCOUNTER — Other Ambulatory Visit: Payer: Self-pay | Admitting: Unknown Physician Specialty

## 2016-03-02 DIAGNOSIS — M25561 Pain in right knee: Secondary | ICD-10-CM

## 2016-03-12 ENCOUNTER — Ambulatory Visit: Payer: Medicare Other

## 2016-03-19 ENCOUNTER — Ambulatory Visit
Admission: RE | Admit: 2016-03-19 | Discharge: 2016-03-19 | Disposition: A | Discharge status: Home / Self Care | Payer: Medicare Other | Source: Ambulatory Visit | Attending: Unknown Physician Specialty | Admitting: Unknown Physician Specialty

## 2016-03-19 DIAGNOSIS — M1711 Unilateral primary osteoarthritis, right knee: Secondary | ICD-10-CM | POA: Diagnosis not present

## 2016-03-19 DIAGNOSIS — M25561 Pain in right knee: Secondary | ICD-10-CM

## 2016-03-19 DIAGNOSIS — M25461 Effusion, right knee: Secondary | ICD-10-CM | POA: Insufficient documentation

## 2016-09-20 IMAGING — MR MR ELBOW*L* W/O CM
7 series · 37 of 40 positions shown · non-contrast
Comparison: None.

CLINICAL DATA: Constant pain and soreness in the left elbow.
Symptoms are chronic. Initial encounter.

EXAM:
MRI OF THE LEFT ELBOW WITHOUT CONTRAST
TECHNIQUE: Multiplanar, multisequence MR imaging of the elbow was performed. No
intravenous contrast was administered.

[Series 3: T1 · axial · 4.0mm · 0.55mm/px · z∈[-18,+110]mm · 5 of 30 slices shown (1 of 2)]
[im 1/30]
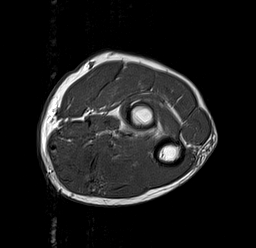
[im 8/30]
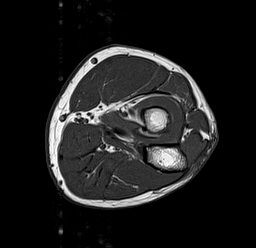
[im 15/30]
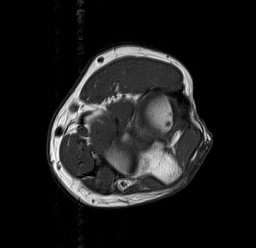
[im 22/30]
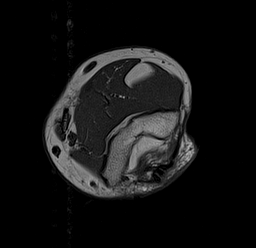
[im 30/30]
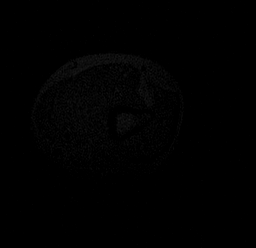

[Series 4: T2 fat-sat · axial · 4.0mm · 0.55mm/px · z∈[-18,+110]mm · 5 of 30 slices shown (1 of 3)]
[im 1/30]
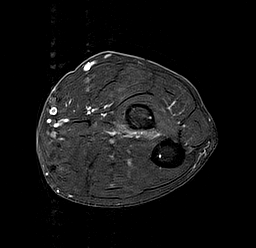
[im 8/30]
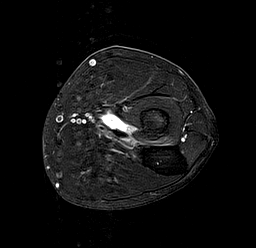
[im 15/30]
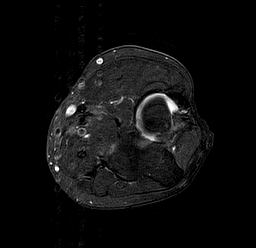
[im 22/30]
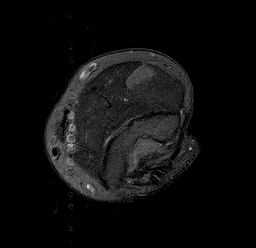
[im 30/30]
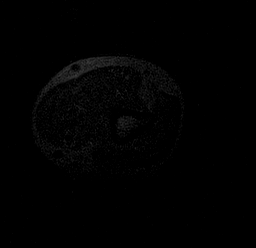

[Series 5: T2 fat-sat · oblique · 3.0mm · 0.55mm/px · 6 of 29 slices shown (2 of 3)]
[im 1/29]
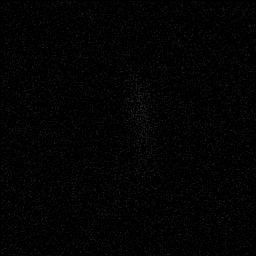
[im 6/29]
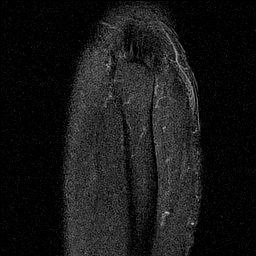
[im 12/29]
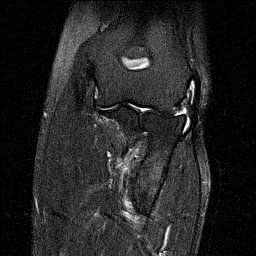
[im 17/29]
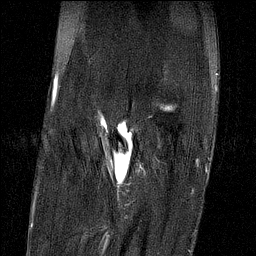
[im 23/29]
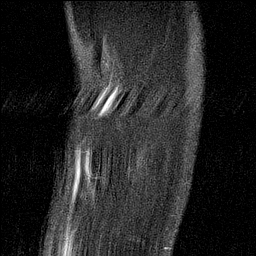
[im 29/29]
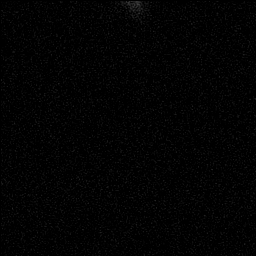

[Series 6: PD fat-sat · oblique · 3.0mm · 0.25mm/px · 6 of 31 slices shown]
[im 1/31]
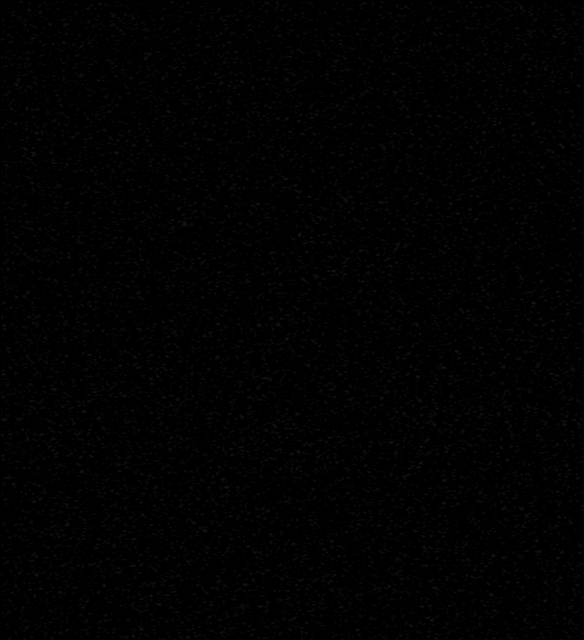
[im 7/31]
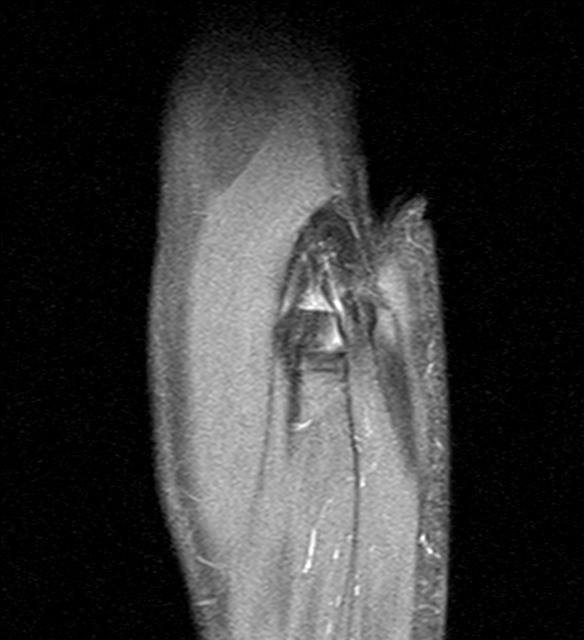
[im 13/31]
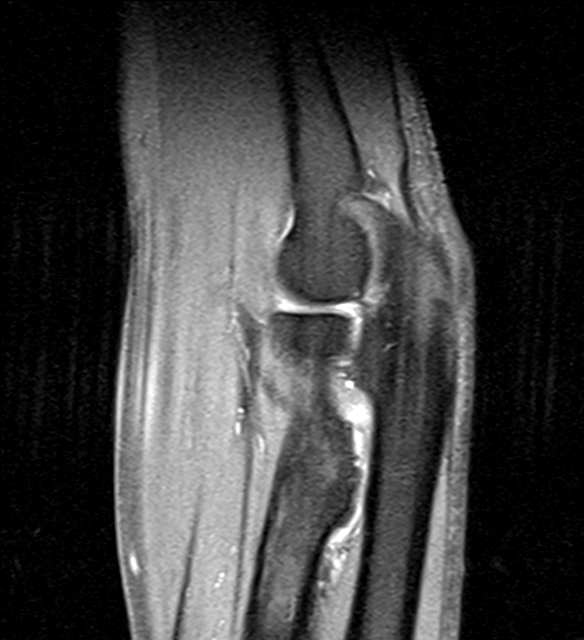
[im 19/31]
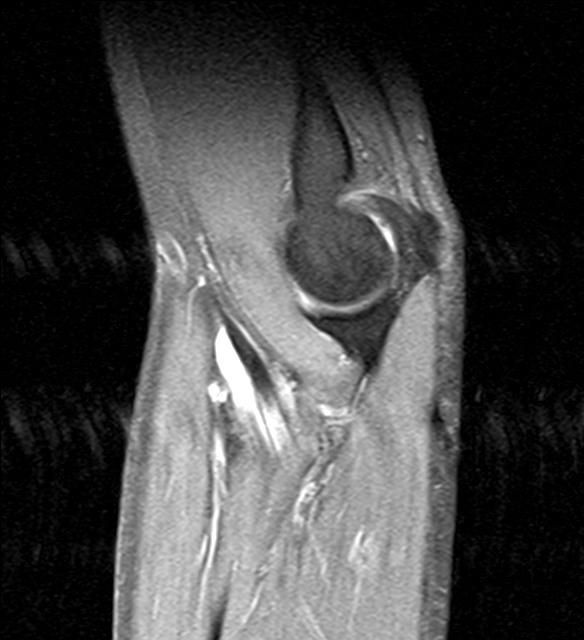
[im 25/31]
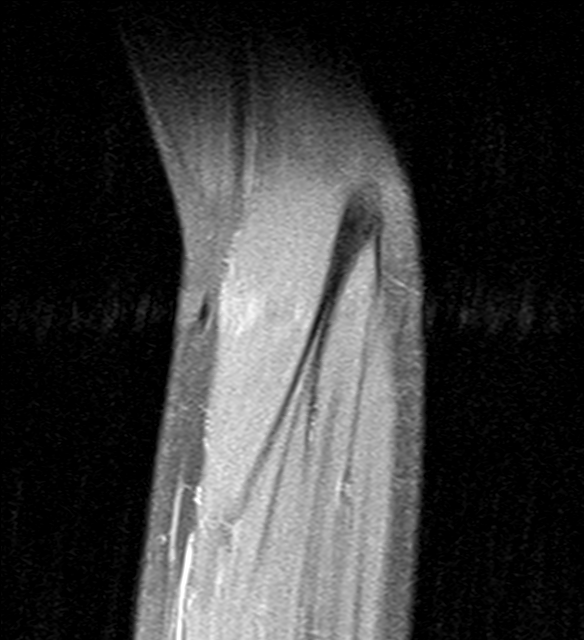
[im 31/31]
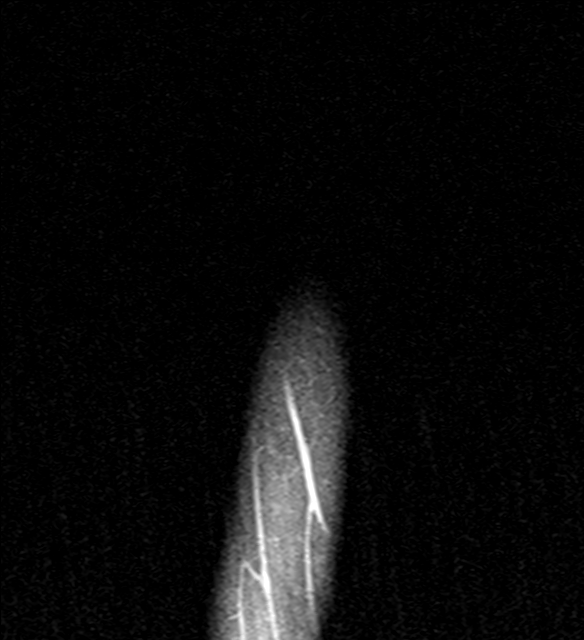

[Series 7: coronal ir if · oblique · 3.0mm · 0.31mm/px · 3 of 29 slices shown]
[im 1/29]
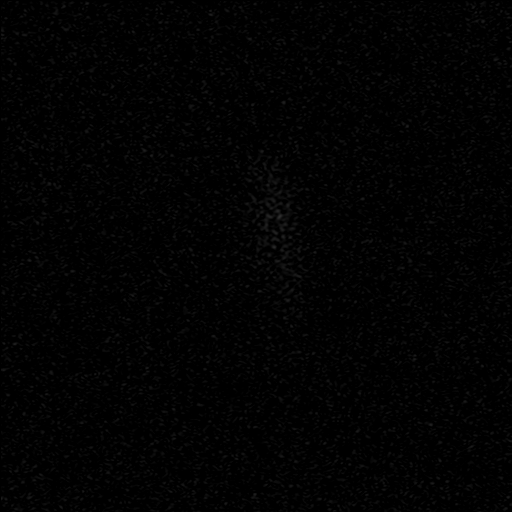
[im 6/29]
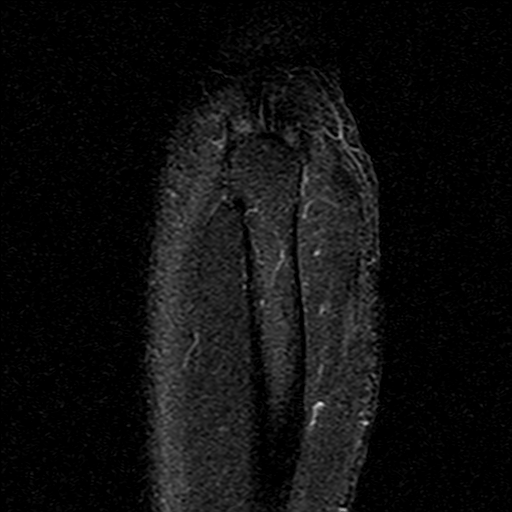
[im 12/29]
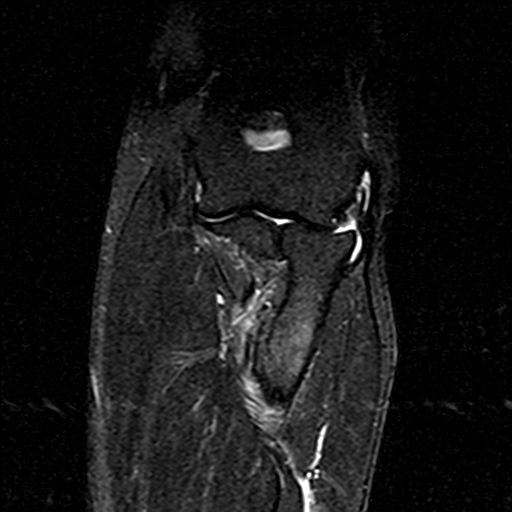

[Series 8: T2 fat-sat · oblique · 3.0mm · 0.55mm/px · 6 of 31 slices shown (3 of 3)]
[im 1/31]
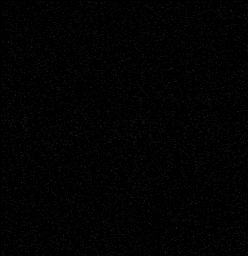
[im 7/31]
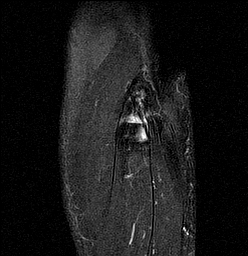
[im 13/31]
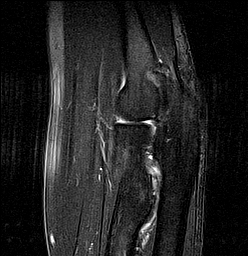
[im 19/31]
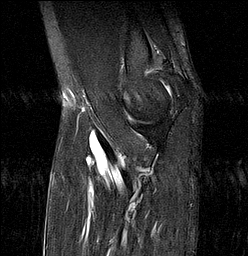
[im 25/31]
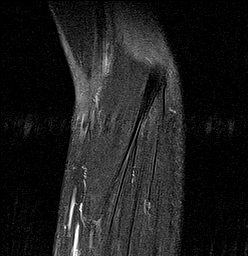
[im 31/31]
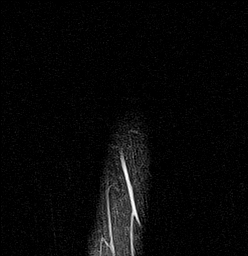

[Series 9: T1 · oblique · 3.0mm · 0.55mm/px · 6 of 29 slices shown (2 of 2)]
[im 1/29]
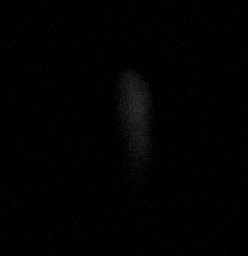
[im 6/29]
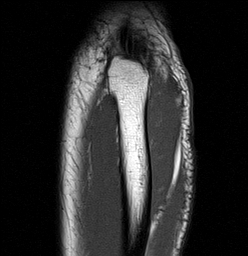
[im 12/29]
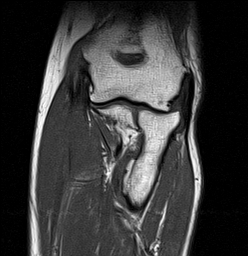
[im 17/29]
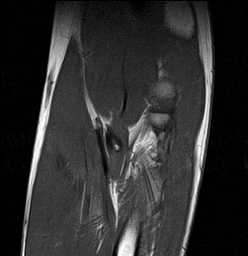
[im 23/29]
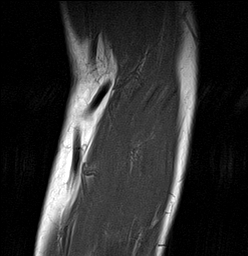
[im 29/29]
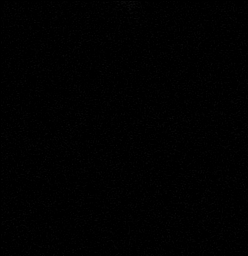

[37 of 40 positions shown; findings below may reference images not displayed]

FINDINGS: TENDONS

Common forearm flexor origin: Intact.

Common forearm extensor origin: Intrasubstance increased T2 signal
is seen in the tendons of the common extensor origin consistent with
epicondylitis. No frank tear is identified.

Biceps: Prominent fluid is seen in the bicipitoradial bursa
consistent with bursitis. The distal most biceps tendon appears
slightly attenuated with mildly increased intrasubstance increased
T2 signal consistent with tendinosis with without tear.

Triceps: Intact.

LIGAMENTS

Medial stabilizers: Intact.

Lateral stabilizers: Intrasubstance increased T2 signal is seen in
the proximal fibers of the lateral ulnar collateral ligament
consistent with sprain without tear. Lateral ligaments are otherwise
intact.

Cartilage: Unremarkable.

Joint: No joint effusion.

Cubital tunnel: Appears normal.

Bones: Normal marrow signal throughout.
IMPRESSION: The study is positive for bicipitoradial bursitis. Tendinosis of the
distal most biceps tendon without tear is also identified.

Lateral epicondylitis with associated sprain of the lateral ulnar
collateral ligament without frank tear.

## 2017-05-02 ENCOUNTER — Encounter: Payer: Self-pay | Admitting: *Deleted

## 2017-05-06 NOTE — Discharge Instructions (Signed)

## 2017-05-09 ENCOUNTER — Ambulatory Visit: Payer: Medicare Other | Admitting: Anesthesiology

## 2017-05-09 ENCOUNTER — Encounter
Admission: RE | Disposition: A | Discharge status: Home / Self Care | Payer: Self-pay | Source: Ambulatory Visit | Attending: Ophthalmology

## 2017-05-09 ENCOUNTER — Ambulatory Visit
Admission: RE | Admit: 2017-05-09 | Discharge: 2017-05-09 | Disposition: A | Discharge status: Home / Self Care | Payer: Medicare Other | Source: Ambulatory Visit | Attending: Ophthalmology | Admitting: Ophthalmology

## 2017-05-09 DIAGNOSIS — E1136 Type 2 diabetes mellitus with diabetic cataract: Secondary | ICD-10-CM | POA: Diagnosis present

## 2017-05-09 DIAGNOSIS — G4730 Sleep apnea, unspecified: Secondary | ICD-10-CM | POA: Diagnosis not present

## 2017-05-09 HISTORY — PX: CATARACT EXTRACTION W/PHACO: SHX586

## 2017-05-09 LAB — GLUCOSE, CAPILLARY
GLUCOSE-CAPILLARY: 117 mg/dL — AB (ref 65–99)
Glucose-Capillary: 114 mg/dL — ABNORMAL HIGH (ref 65–99)

## 2017-05-09 SURGERY — PHACOEMULSIFICATION, CATARACT, WITH IOL INSERTION
Anesthesia: Monitor Anesthesia Care | Laterality: Right | Wound class: Clean

## 2017-05-09 MED ORDER — BSS IO SOLN
INTRAOCULAR | Status: DC | PRN
  Administered 2017-05-09: 68 mL via OPHTHALMIC

## 2017-05-09 MED ORDER — ARMC OPHTHALMIC DILATING DROPS
1.0000 "application " | OPHTHALMIC | Status: DC | PRN
  Administered 2017-05-09 (×3): 1 via OPHTHALMIC

## 2017-05-09 MED ORDER — FENTANYL CITRATE (PF) 100 MCG/2ML IJ SOLN
INTRAMUSCULAR | Status: DC | PRN
  Administered 2017-05-09 (×2): 50 ug via INTRAVENOUS

## 2017-05-09 MED ORDER — SODIUM HYALURONATE 10 MG/ML IO SOLN
INTRAOCULAR | Status: DC | PRN
  Administered 2017-05-09: 0.55 mL via INTRAOCULAR

## 2017-05-09 MED ORDER — LACTATED RINGERS IV SOLN: INTRAVENOUS | Status: DC

## 2017-05-09 MED ORDER — MIDAZOLAM HCL 2 MG/2ML IJ SOLN
INTRAMUSCULAR | Status: DC | PRN
  Administered 2017-05-09: 2 mg via INTRAVENOUS

## 2017-05-09 MED ORDER — SODIUM HYALURONATE 23 MG/ML IO SOLN
INTRAOCULAR | Status: DC | PRN
  Administered 2017-05-09: 0.6 mL via INTRAOCULAR

## 2017-05-09 MED ORDER — LIDOCAINE HCL (PF) 2 % IJ SOLN
INTRAOCULAR | Status: DC | PRN
  Administered 2017-05-09: 1 mL via INTRAOCULAR

## 2017-05-09 MED ORDER — MOXIFLOXACIN HCL 0.5 % OP SOLN
OPHTHALMIC | Status: DC | PRN
  Administered 2017-05-09: 1 [drp] via OPHTHALMIC

## 2017-05-09 SURGICAL SUPPLY — 16 items

## 2017-05-09 NOTE — Anesthesia Procedure Notes (Signed)
Procedure Name: MAC Performed by: Nataleigh Griffin Pre-anesthesia Checklist: Patient identified, Emergency Drugs available, Suction available, Timeout performed and Patient being monitored Patient Re-evaluated:Patient Re-evaluated prior to inductionOxygen Delivery Method: Nasal cannula Placement Confirmation: positive ETCO2       

## 2017-05-09 NOTE — Transfer of Care (Signed)
Immediate Anesthesia Transfer of Care Note  Patient: Alexander Hart  Procedure(s) Performed: CATARACT EXTRACTION PHACO AND INTRAOCULAR LENS PLACEMENT (IOC) RIGHT DIABETIC (Right )  Patient Location: PACU  Anesthesia Type: MAC  Level of Consciousness: awake, alert  and patient cooperative  Airway and Oxygen Therapy: Patient Spontanous Breathing and Patient connected to supplemental oxygen  Post-op Assessment: Post-op Vital signs reviewed, Patient's Cardiovascular Status Stable, Respiratory Function Stable, Patent Airway and No signs of Nausea or vomiting  Post-op Vital Signs: Reviewed and stable  Complications: No apparent anesthesia complications

## 2017-05-09 NOTE — H&P (Signed)
The History and Physical notes are on paper, have been signed, and are to be scanned.   I have examined the patient and there are no changes to the H&P.   Willey Blade 05/09/2017 8:13 AM

## 2017-05-09 NOTE — Op Note (Signed)
OPERATIVE NOTE  Alexander Hart [id number] 05/09/2017   PREOPERATIVE DIAGNOSIS:  Nuclear sclerotic cataract right eye.  H25.11   POSTOPERATIVE DIAGNOSIS:    Nuclear sclerotic cataract right eye.     PROCEDURE:  Phacoemusification with posterior chamber intraocular lens placement of the right eye   LENS:   Implant Name Type Inv. Item Serial No. Manufacturer Lot No. LRB No. Used  LENS IOL DIOP 21.0 - [id number] Intraocular Lens LENS IOL DIOP 21.0 [id number] AMO   Right 1       PCB00 +21.0   ULTRASOUND TIME: 0 minutes 23.6 seconds.  CDE 3.56   SURGEON:  Willey Blade, MD, MPH  ANESTHESIOLOGIST: Anesthesiologist: Ranee Gosselin, MD CRNA: Jimmy Picket, CRNA   ANESTHESIA:  Topical with tetracaine drops augmented with 1% preservative-free intracameral lidocaine.  ESTIMATED BLOOD LOSS: less than 1 mL.   COMPLICATIONS:  None.   DESCRIPTION OF PROCEDURE:  The patient was identified in the holding room and transported to the operating room and placed in the supine position under the operating microscope.  The right eye was identified as the operative eye and it was prepped and draped in the usual sterile ophthalmic fashion.   A 1.0 millimeter clear-corneal paracentesis was made at the 10:30 position. 0.5 ml of preservative-free 1% lidocaine with epinephrine was injected into the anterior chamber.  The anterior chamber was filled with Healon 5 viscoelastic.  A 2.4 millimeter keratome was used to make a near-clear corneal incision at the 8:00 position.  A curvilinear capsulorrhexis was made with a cystotome and capsulorrhexis forceps.  Balanced salt solution was used to hydrodissect and hydrodelineate the nucleus.   Phacoemulsification was then used in stop and chop fashion to remove the lens nucleus and epinucleus.  The remaining cortex was then removed using the irrigation and aspiration handpiece. Healon was then placed into the capsular bag to distend it for lens placement.  A lens was  then injected into the capsular bag.  The remaining viscoelastic was aspirated.   Wounds were hydrated with balanced salt solution.  The anterior chamber was inflated to a physiologic pressure with balanced salt solution.   Intracameral vigamox 0.1 mL undiluted was injected into the eye and a drop placed onto the ocular surface.  No wound leaks were noted.  The patient was taken to the recovery room in stable condition without complications of anesthesia or surgery  Willey Blade 05/09/2017, 8:46 AM

## 2017-05-09 NOTE — Anesthesia Postprocedure Evaluation (Signed)
Anesthesia Post Note  Patient: Alexander Hart  Procedure(s) Performed: CATARACT EXTRACTION PHACO AND INTRAOCULAR LENS PLACEMENT (IOC) RIGHT DIABETIC (Right )  Patient location during evaluation: PACU Anesthesia Type: MAC Level of consciousness: awake and alert and oriented Pain management: satisfactory to patient Vital Signs Assessment: post-procedure vital signs reviewed and stable Respiratory status: spontaneous breathing, nonlabored ventilation and respiratory function stable Cardiovascular status: blood pressure returned to baseline and stable Postop Assessment: Adequate PO intake and No signs of nausea or vomiting Anesthetic complications: no    Raliegh Ip

## 2017-05-09 NOTE — Anesthesia Preprocedure Evaluation (Signed)
Anesthesia Evaluation  Patient identified by MRN, date of birth, ID band Patient awake    Reviewed: Allergy & Precautions, H&P , NPO status , Patient's Chart, lab work & pertinent test results  Airway Mallampati: II  TM Distance: >3 FB Neck ROM: full    Dental no notable dental hx.    Pulmonary sleep apnea ,    Pulmonary exam normal breath sounds clear to auscultation       Cardiovascular Normal cardiovascular exam Rhythm:regular Rate:Normal     Neuro/Psych    GI/Hepatic   Endo/Other  diabetes  Renal/GU      Musculoskeletal   Abdominal   Peds  Hematology   Anesthesia Other Findings   Reproductive/Obstetrics                             Anesthesia Physical Anesthesia Plan  ASA: II  Anesthesia Plan: MAC   Post-op Pain Management:    Induction:   PONV Risk Score and Plan: 1 and Midazolam  Airway Management Planned:   Additional Equipment:   Intra-op Plan:   Post-operative Plan:   Informed Consent: I have reviewed the patients History and Physical, chart, labs and discussed the procedure including the risks, benefits and alternatives for the proposed anesthesia with the patient or authorized representative who has indicated his/her understanding and acceptance.     Plan Discussed with: CRNA  Anesthesia Plan Comments:         Anesthesia Quick Evaluation

## 2017-05-10 ENCOUNTER — Encounter: Payer: Self-pay | Admitting: Ophthalmology

## 2017-06-13 ENCOUNTER — Encounter: Payer: Self-pay | Admitting: *Deleted

## 2017-06-14 NOTE — Discharge Instructions (Signed)

## 2017-06-20 MED ORDER — ACETAMINOPHEN 160 MG/5ML PO SOLN: 325.0000 mg | Freq: Once | ORAL | Status: DC

## 2017-06-20 MED ORDER — ACETAMINOPHEN 325 MG PO TABS: 325.0000 mg | ORAL_TABLET | Freq: Once | ORAL | Status: DC

## 2017-06-21 ENCOUNTER — Ambulatory Visit
Admission: RE | Admit: 2017-06-21 | Discharge: 2017-06-21 | Disposition: A | Discharge status: Home / Self Care | Payer: Medicare Other | Source: Ambulatory Visit | Attending: Ophthalmology | Admitting: Ophthalmology

## 2017-06-21 ENCOUNTER — Ambulatory Visit: Payer: Medicare Other | Admitting: Anesthesiology

## 2017-06-21 ENCOUNTER — Encounter
Admission: RE | Disposition: A | Discharge status: Home / Self Care | Payer: Self-pay | Source: Ambulatory Visit | Attending: Ophthalmology

## 2017-06-21 DIAGNOSIS — G4730 Sleep apnea, unspecified: Secondary | ICD-10-CM | POA: Diagnosis not present

## 2017-06-21 DIAGNOSIS — E1136 Type 2 diabetes mellitus with diabetic cataract: Secondary | ICD-10-CM | POA: Insufficient documentation

## 2017-06-21 HISTORY — PX: CATARACT EXTRACTION W/PHACO: SHX586

## 2017-06-21 LAB — GLUCOSE, CAPILLARY: Glucose-Capillary: 126 mg/dL — ABNORMAL HIGH (ref 65–99)

## 2017-06-21 SURGERY — PHACOEMULSIFICATION, CATARACT, WITH IOL INSERTION
Anesthesia: Monitor Anesthesia Care | Laterality: Left | Wound class: Clean

## 2017-06-21 MED ORDER — SODIUM HYALURONATE 23 MG/ML IO SOLN
INTRAOCULAR | Status: DC | PRN
  Administered 2017-06-21: 0.6 mL via INTRAOCULAR

## 2017-06-21 MED ORDER — EPINEPHRINE PF 1 MG/ML IJ SOLN
INTRAOCULAR | Status: DC | PRN
  Administered 2017-06-21: 73 mL via OPHTHALMIC

## 2017-06-21 MED ORDER — MOXIFLOXACIN HCL 0.5 % OP SOLN
OPHTHALMIC | Status: DC | PRN
  Administered 2017-06-21: 0.2 mL via OPHTHALMIC

## 2017-06-21 MED ORDER — LACTATED RINGERS IV SOLN: 10.0000 mL/h | INTRAVENOUS | Status: DC

## 2017-06-21 MED ORDER — FENTANYL CITRATE (PF) 100 MCG/2ML IJ SOLN
INTRAMUSCULAR | Status: DC | PRN
  Administered 2017-06-21: 100 ug via INTRAVENOUS

## 2017-06-21 MED ORDER — ARMC OPHTHALMIC DILATING DROPS
1.0000 "application " | OPHTHALMIC | Status: DC | PRN
  Administered 2017-06-21 (×3): 1 via OPHTHALMIC

## 2017-06-21 MED ORDER — SODIUM HYALURONATE 10 MG/ML IO SOLN
INTRAOCULAR | Status: DC | PRN
  Administered 2017-06-21: 0.55 mL via INTRAOCULAR

## 2017-06-21 MED ORDER — LIDOCAINE HCL (PF) 2 % IJ SOLN
INTRAOCULAR | Status: DC | PRN
  Administered 2017-06-21: 1 mL via INTRAOCULAR

## 2017-06-21 MED ORDER — ONDANSETRON HCL 4 MG/2ML IJ SOLN: 4.0000 mg | Freq: Once | INTRAMUSCULAR | Status: DC | PRN

## 2017-06-21 MED ORDER — MIDAZOLAM HCL 2 MG/2ML IJ SOLN
INTRAMUSCULAR | Status: DC | PRN
  Administered 2017-06-21: 2 mg via INTRAVENOUS

## 2017-06-21 MED ORDER — ACETAMINOPHEN 325 MG PO TABS: 325.0000 mg | ORAL_TABLET | ORAL | Status: DC | PRN

## 2017-06-21 MED ORDER — ACETAMINOPHEN 160 MG/5ML PO SOLN: 325.0000 mg | ORAL | Status: DC | PRN

## 2017-06-21 MED ORDER — TETRACAINE HCL 0.5 % OP SOLN
OPHTHALMIC | Status: DC | PRN
  Administered 2017-06-21: 2 [drp] via OPHTHALMIC

## 2017-06-21 SURGICAL SUPPLY — 16 items

## 2017-06-21 NOTE — Anesthesia Postprocedure Evaluation (Signed)
Anesthesia Post Note  Patient: Alexander Hart  Procedure(s) Performed: CATARACT EXTRACTION PHACO AND INTRAOCULAR LENS PLACEMENT (IOC)  LEFT DIABETIC (Left )  Patient location during evaluation: PACU Anesthesia Type: MAC Level of consciousness: awake Pain management: pain level controlled Vital Signs Assessment: post-procedure vital signs reviewed and stable Respiratory status: respiratory function stable Cardiovascular status: stable Postop Assessment: no signs of nausea or vomiting Anesthetic complications: no    Veda Canning

## 2017-06-21 NOTE — Transfer of Care (Signed)
Immediate Anesthesia Transfer of Care Note  Patient: Alexander Hart  Procedure(s) Performed: CATARACT EXTRACTION PHACO AND INTRAOCULAR LENS PLACEMENT (IOC)  LEFT DIABETIC (Left )  Patient Location: PACU  Anesthesia Type: MAC  Level of Consciousness: awake, alert  and patient cooperative  Airway and Oxygen Therapy: Patient Spontanous Breathing and Patient connected to supplemental oxygen  Post-op Assessment: Post-op Vital signs reviewed, Patient's Cardiovascular Status Stable, Respiratory Function Stable, Patent Airway and No signs of Nausea or vomiting  Post-op Vital Signs: Reviewed and stable  Complications: No apparent anesthesia complications

## 2017-06-21 NOTE — OR Nursing (Signed)
[facility] the end of the [facility], found a hole in one of her gloves. She said it was only a hole and she had not been stuck by anything.

## 2017-06-21 NOTE — H&P (Signed)
The History and Physical notes are on paper, have been signed, and are to be scanned.   I have examined the patient and there are no changes to the H&P.   Willey BladeBradley King 06/21/2017 9:24 AM

## 2017-06-21 NOTE — Anesthesia Preprocedure Evaluation (Signed)
Anesthesia Evaluation  Patient identified by MRN, date of birth, ID band Patient awake    Reviewed: Allergy & Precautions, H&P , NPO status   Airway Mallampati: II  TM Distance: >3 FB Neck ROM: full    Dental no notable dental hx.    Pulmonary sleep apnea ,    Pulmonary exam normal breath sounds clear to auscultation       Cardiovascular Normal cardiovascular exam Rhythm:regular Rate:Normal     Neuro/Psych    GI/Hepatic   Endo/Other  diabetes  Renal/GU      Musculoskeletal   Abdominal   Peds  Hematology   Anesthesia Other Findings   Reproductive/Obstetrics                             Anesthesia Physical  Anesthesia Plan  ASA: II  Anesthesia Plan: MAC   Post-op Pain Management:    Induction:   PONV Risk Score and Plan: 1 and Midazolam  Airway Management Planned: Nasal Cannula  Additional Equipment:   Intra-op Plan:   Post-operative Plan:   Informed Consent: I have reviewed the patients History and Physical, chart, labs and discussed the procedure including the risks, benefits and alternatives for the proposed anesthesia with the patient or authorized representative who has indicated his/her understanding and acceptance.     Plan Discussed with: CRNA  Anesthesia Plan Comments:         Anesthesia Quick Evaluation

## 2017-06-21 NOTE — Anesthesia Procedure Notes (Signed)
Procedure Name: MAC Performed by: Ron Beske, CRNA Pre-anesthesia Checklist: Patient identified, Emergency Drugs available, Suction available, Timeout performed and Patient being monitored Patient Re-evaluated:Patient Re-evaluated prior to induction Oxygen Delivery Method: Nasal cannula Placement Confirmation: positive ETCO2       

## 2017-06-21 NOTE — Op Note (Signed)
OPERATIVE NOTE  Alexander BreslowGary W Hart [id number] 06/21/2017   PREOPERATIVE DIAGNOSIS:  Nuclear sclerotic cataract left eye.  H25.12   POSTOPERATIVE DIAGNOSIS:    Nuclear sclerotic cataract left eye.     PROCEDURE:  Phacoemusification with posterior chamber intraocular lens placement of the left eye   LENS:   Implant Name Type Inv. Item Serial No. Manufacturer Lot No. LRB No. Used  LENS IOL DIOP 21.0 - [id number] Intraocular Lens LENS IOL DIOP 21.0 [id number]  Left 1       PCB00 +21.0   ULTRASOUND TIME: 0 minutes 19.7 seconds.  CDE 3.12   SURGEON:  Willey BladeBradley Reginal Wojcicki, MD, MPH   ANESTHESIA:  Topical with tetracaine drops augmented with 1% preservative-free intracameral lidocaine.  ESTIMATED BLOOD LOSS: <1 mL   COMPLICATIONS:  None.   DESCRIPTION OF PROCEDURE:  The patient was identified in the holding room and transported to the operating room and placed in the supine position under the operating microscope.  The left eye was identified as the operative eye and it was prepped and draped in the usual sterile ophthalmic fashion.   A 1.0 millimeter clear-corneal paracentesis was made at the 5:00 position. 0.5 ml of preservative-free 1% lidocaine with epinephrine was injected into the anterior chamber.  The anterior chamber was filled with Healon 5 viscoelastic.  A 2.4 millimeter keratome was used to make a near-clear corneal incision at the 2:00 position.  A curvilinear capsulorrhexis was made with a cystotome and capsulorrhexis forceps.  Balanced salt solution was used to hydrodissect and hydrodelineate the nucleus.   Phacoemulsification was then used in stop and chop fashion to remove the lens nucleus and epinucleus.  The remaining cortex was then removed using the irrigation and aspiration handpiece. Healon was then placed into the capsular bag to distend it for lens placement.  A lens was then injected into the capsular bag.  The remaining viscoelastic was aspirated.   Wounds were  hydrated with balanced salt solution.  The anterior chamber was inflated to a physiologic pressure with balanced salt solution.  Intracameral vigamox 0.1 mL undiltued was injected into the eye and a drop placed onto the ocular surface. No wound leaks were noted.  The patient was taken to the recovery room in stable condition without complications of anesthesia or surgery  Willey BladeBradley Emilee Market 06/21/2017, 10:02 AM

## 2017-06-22 ENCOUNTER — Encounter: Payer: Self-pay | Admitting: Ophthalmology

## 2017-12-12 ENCOUNTER — Ambulatory Visit
Admission: EM | Admit: 2017-12-12 | Discharge: 2017-12-12 | Disposition: A | Discharge status: Home / Self Care | Payer: Medicare Other | Attending: Emergency Medicine | Admitting: Emergency Medicine

## 2017-12-12 ENCOUNTER — Other Ambulatory Visit: Payer: Self-pay

## 2017-12-12 DIAGNOSIS — J0100 Acute maxillary sinusitis, unspecified: Secondary | ICD-10-CM

## 2017-12-12 DIAGNOSIS — R059 Cough, unspecified: Secondary | ICD-10-CM

## 2017-12-12 DIAGNOSIS — R05 Cough: Secondary | ICD-10-CM | POA: Diagnosis not present

## 2017-12-12 MED ORDER — DOXYCYCLINE HYCLATE 100 MG PO CAPS: 100.0000 mg | ORAL_CAPSULE | Freq: Two times a day (BID) | ORAL | 0 refills | Status: DC

## 2017-12-12 MED ORDER — BENZONATATE 100 MG PO CAPS: 100.0000 mg | ORAL_CAPSULE | Freq: Three times a day (TID) | ORAL | 0 refills | Status: DC | PRN

## 2017-12-12 MED ORDER — HYDROCOD POLST-CPM POLST ER 10-8 MG/5ML PO SUER: 3.0000 mL | Freq: Every evening | ORAL | 0 refills | Status: DC | PRN

## 2017-12-12 NOTE — ED Provider Notes (Signed)
[facility] ____________________________________________  Time seen: Approximately 8:38 AM  I have reviewed the triage vital signs and the nursing notes.   HISTORY  Chief Complaint Cough   HPI Alexander Hart is a 72 y.o. male presenting for evaluation of cough for 1 week.  States initially symptoms started off like he was catching a cold per patient, with runny nose, nasal congestion and cough.  Reports still has some sinus drainage and postnasal drainage, but reports primarily cough concerns.  Reports some chest congestion, but states cough is worse at night and disrupting sleep.  States feels sore on the sides from coughing so much.  Denies known fevers.  Has not taken any medications over-the-counter, as he reports his wife is been out of town and she usually gives him medicines.  Continues to overall eat and drink well.  Denies history of COPD, asthma or chronic respiratory problems.  Denies cardiac history.  Denies known sick contacts.  Denies other aggravating or alleviating factors. Denies chest pain, shortness of breath, extremity swelling or rash. Denies recent sickness. Denies recent antibiotic use.   Cyndie Mull, DO: PCP   Past Medical History:  Diagnosis Date  . Anxiety    CONTROLLED ON PROZAC  . Diabetes mellitus without complication (HCC)    TYPE 2  . Epicondylitis syndrome of elbow    LEFT, ARTHRITIS ANKLES,SHOULDERS,KNEES  . Hyperlipidemia   . Motion sickness    CAR  . Restless leg syndrome   . Sleep apnea    S/P T&A AND SINUS SURGERY, USES CPAP    There are no active problems to display for this patient.   Past Surgical History:  Procedure Laterality Date  . ANTERIOR FUSION CERVICAL SPINE     C5-6  . CATARACT EXTRACTION W/PHACO Right 05/09/2017   Procedure: CATARACT EXTRACTION PHACO AND INTRAOCULAR LENS PLACEMENT (IOC) RIGHT DIABETIC;  Surgeon: Nevada Crane, MD;  Location[facility];  Service: Ophthalmology;   Laterality: Right;  Diabetic - oral meds sleep apnea  . CATARACT EXTRACTION W/PHACO Left 06/21/2017   Procedure: CATARACT EXTRACTION PHACO AND INTRAOCULAR LENS PLACEMENT (IOC)  LEFT DIABETIC;  Surgeon: Nevada Crane, MD;  Location: [facility];  Service: Ophthalmology;  Laterality: Left;  Diabetic - oral meds sleep apnea  . FOOT FRACTURE SURGERY Left   . NASAL TURBINATE REDUCTION    . TENNIS ELBOW RELEASE/NIRSCHEL PROCEDURE Left 05/23/2015   Procedure: TENNIS ELBOW RELEASE/NIRSCHEL PROCEDURE;  Surgeon: Erin Sons, MD;  Location: [facility];  Service: Orthopedics;  Laterality: Left;  CPAP AND DIABETIC  . TONSILLECTOMY    . TRIGGER FINGER RELEASE Bilateral 2005     No current facility-administered medications for this encounter.   Current Outpatient Medications:  .  aspirin 81 MG tablet, Take 81 mg by mouth daily. AM, Disp: , Rfl:  .  atorvastatin (LIPITOR) 20 MG tablet, Take 20 mg by mouth daily. AM, Disp: , Rfl:  .  clonazePAM (KLONOPIN) 1 MG tablet, Take 1 mg by mouth as needed. , Disp: , Rfl:  .  FLUoxetine (PROZAC) 40 MG capsule, Take 40 mg by mouth daily. AM, Disp: , Rfl:  .  Linagliptin-Metformin HCl 2.11-998 MG TABS, Take by mouth 2 (two) times daily. AM AND PM, Disp: , Rfl:  .  lisinopril (PRINIVIL,ZESTRIL) 20 MG tablet, Take 20 mg by mouth daily., Disp: , Rfl:  .  pioglitazone (ACTOS) 30 MG tablet, Take 30 mg by mouth daily. AM, Disp: , Rfl:  .  pramipexole (  MIRAPEX) 1 MG tablet, Take 2 mg by mouth daily. PM, Disp: , Rfl:  .  sitaGLIPtin (JANUVIA) 25 MG tablet, Take 25 mg daily by mouth., Disp: , Rfl:  .  benzonatate (TESSALON PERLES) 100 MG capsule, Take 1 capsule (100 mg total) by mouth 3 (three) times daily as needed for cough., Disp: 15 capsule, Rfl: 0 .  chlorpheniramine-HYDROcodone (TUSSIONEX PENNKINETIC ER) 10-8 MG/5ML SUER, Take 3 mLs by mouth at bedtime as needed for cough. do not drive or operate machinery while taking as can cause drowsiness.,  Disp: 40 mL, Rfl: 0 .  doxycycline (VIBRAMYCIN) 100 MG capsule, Take 1 capsule (100 mg total) by mouth 2 (two) times daily., Disp: 20 capsule, Rfl: 0  Allergies Patient has no known allergies.  History reviewed. No pertinent family history.  Social History Social History   Tobacco Use  . Smoking status: Never Smoker  . Smokeless tobacco: Never Used  Substance Use Topics  . Alcohol use: Yes    Comment: 1/ MONTH WINE  . Drug use: No    Review of Systems Constitutional: No fever ENT: No sore throat. Cardiovascular: Denies chest pain. Respiratory: Denies shortness of breath. Gastrointestinal: No abdominal pain.   Musculoskeletal: Negative for back pain. Skin: Negative for rash.   ____________________________________________   PHYSICAL EXAM:  VITAL SIGNS: ED Triage Vitals  Enc Vitals Group     BP 12/12/17 0811 121/73     Pulse Rate 12/12/17 0811 80     Resp 12/12/17 0811 17     Temp 12/12/17 0811 98.7 F (37.1 C)     Temp Source 12/12/17 0811 Oral     SpO2 12/12/17 0811 97 %     Weight 12/12/17 0809 195 lb (88.5 kg)     Height 12/12/17 0809  (1.753 m)     Head Circumference --      Peak Flow --      Pain Score 12/12/17 0808 10     Pain Loc --      Pain Edu? --      Excl. in GC? --     Constitutional: Alert and oriented. Well appearing and in no acute distress. Eyes: Conjunctivae are normal Head: Atraumatic. No sinus tenderness to palpation. No swelling. No erythema.  Ears: no erythema, normal TMs bilaterally.   Nose:Nasal congestion   Mouth/Throat: Mucous membranes are moist. No pharyngeal erythema. No tonsillar swelling or exudate.  Neck: No stridor.  No cervical spine tenderness to palpation. Hematological/Lymphatic/Immunilogical: No cervical lymphadenopathy. Cardiovascular: Normal rate, regular rhythm. Grossly normal heart sounds.  Good peripheral circulation. Respiratory: Normal respiratory effort.  No retractions.  Mild scattered rhonchi.  No  wheezes.  No focal area of consolidation auscultated. Occasional dry cough noted in room.  Good air movement.  Speaks in complete sentences. Musculoskeletal: Ambulatory with steady gait.  Bilateral lower extremities nontender and no edema noted. Neurologic:  Normal speech and language. No gait instability. Skin:  Skin appears warm, dry and intact. No rash noted. Psychiatric: Mood and affect are normal. Speech and behavior are normal.  ___________________________________________   LABS (all labs ordered are listed, but only abnormal results are displayed)  Labs Reviewed - No data to display ____________________________________________   PROCEDURES Procedures    INITIAL IMPRESSION / ASSESSMENT AND PLAN / ED COURSE  Pertinent labs & imaging results that were available during my care of the patient were reviewed by me and considered in my medical decision making (see chart for details).  Well-appearing patient.  No  acute distress.  Presenting for evaluation of cough and congestion symptoms.  Suspect recent viral illness, and concerned for  secondary infection.  Will treat with oral doxycycline, PRN Tessalon Perles and PRN Tussionex.  Encourage rest, fluids, supportive care. Discussed indication, risks and benefits of medications with patient.   Discussed follow up with Primary care physician this week. Discussed follow up and return parameters including no resolution or any worsening concerns. Patient verbalized understanding and agreed to plan.   ____________________________________________   FINAL CLINICAL IMPRESSION(S) / ED DIAGNOSES  Final diagnoses:  Acute maxillary sinusitis, recurrence not specified  Cough     ED Discharge Orders        Ordered    doxycycline (VIBRAMYCIN) 100 MG capsule  2 times daily     12/12/17 0837    benzonatate (TESSALON PERLES) 100 MG capsule  3 times daily PRN     12/12/17 0837    chlorpheniramine-HYDROcodone (TUSSIONEX PENNKINETIC ER) 10-8  MG/5ML SUER  At bedtime PRN     12/12/17 9562       Note: This dictation was prepared with Dragon dictation along with smaller phrase technology. Any transcriptional errors that result from this process are unintentional.         Renford Dills, NP 12/12/17 [id number]

## 2017-12-12 NOTE — ED Triage Notes (Signed)
Patient complains of cough with productivity. Patient states that he has coughed so much that his ribs ache on both sides. Patient states that cough started one week ago.

## 2017-12-12 NOTE — Discharge Instructions (Addendum)
Take medication as prescribed. Rest. Drink plenty of fluids.  ° °Follow up with your primary care physician this week as needed. Return to Urgent care for new or worsening concerns.  ° °

## 2018-04-10 ENCOUNTER — Other Ambulatory Visit: Payer: Self-pay

## 2018-04-10 ENCOUNTER — Encounter: Payer: Self-pay | Admitting: Emergency Medicine

## 2018-04-10 ENCOUNTER — Ambulatory Visit
Admission: EM | Admit: 2018-04-10 | Discharge: 2018-04-10 | Disposition: A | Discharge status: Home / Self Care | Payer: Medicare Other | Attending: Family Medicine | Admitting: Family Medicine

## 2018-04-10 DIAGNOSIS — R05 Cough: Secondary | ICD-10-CM | POA: Diagnosis not present

## 2018-04-10 DIAGNOSIS — R0981 Nasal congestion: Secondary | ICD-10-CM

## 2018-04-10 DIAGNOSIS — R059 Cough, unspecified: Secondary | ICD-10-CM

## 2018-04-10 MED ORDER — DOXYCYCLINE HYCLATE 100 MG PO CAPS: 100.0000 mg | ORAL_CAPSULE | Freq: Two times a day (BID) | ORAL | 0 refills | Status: DC

## 2018-04-10 MED ORDER — HYDROCOD POLST-CPM POLST ER 10-8 MG/5ML PO SUER: 5.0000 mL | Freq: Every evening | ORAL | 0 refills | Status: DC | PRN

## 2018-04-10 MED ORDER — BENZONATATE 100 MG PO CAPS: 100.0000 mg | ORAL_CAPSULE | Freq: Three times a day (TID) | ORAL | 0 refills | Status: DC | PRN

## 2018-04-10 NOTE — ED Provider Notes (Signed)
[facility] ____________________________________________  Time seen: Approximately 1045 AM  I have reviewed the triage vital signs and the nursing notes.   HISTORY  Chief Complaint Cough  HPI Alexander Hart is a 72 y.o. male presented for evaluation of 1 week of cough and chest congestion.  States cough initially started with a tickle in his throat but is since gone more into his chest with production of thickish mucus.  Denies hemoptysis.  Denies any fevers.  On nasal congestion, but denies any sinus pain.  Reports his wife recently sick with similar.  Denies any wheezing, chest pain or shortness of breath.  Is continued to eat and drink well.  Unresolved with over-the-counter cough and congestion medications.  Reports similar to previous bronchitis.  Denies recent medical history changes.  Denies other aggravating or alleviating factors.  Reports otherwise feels well. Denies chest pain, shortness of breath, abdominal pain, extremity swelling or rash. Denies recent sickness. Denies recent antibiotic use.   Cyndie Mull, DO: PCP   Past Medical History:  Diagnosis Date  . Anxiety    CONTROLLED ON PROZAC  . Diabetes mellitus without complication (HCC)    TYPE 2  . Epicondylitis syndrome of elbow    LEFT, ARTHRITIS ANKLES,SHOULDERS,KNEES  . Hyperlipidemia   . Motion sickness    CAR  . Restless leg syndrome   . Sleep apnea    S/P T&A AND SINUS SURGERY, USES CPAP    There are no active problems to display for this patient.   Past Surgical History:  Procedure Laterality Date  . ANTERIOR FUSION CERVICAL SPINE     C5-6  . CATARACT EXTRACTION W/PHACO Right 05/09/2017   Procedure: CATARACT EXTRACTION PHACO AND INTRAOCULAR LENS PLACEMENT (IOC) RIGHT DIABETIC;  Surgeon: Nevada Crane, MD;  Location: [facility];  Service: Ophthalmology;  Laterality: Right;  Diabetic - oral meds sleep apnea  . CATARACT EXTRACTION W/PHACO Left 06/21/2017   Procedure: CATARACT EXTRACTION PHACO AND INTRAOCULAR LENS PLACEMENT (IOC)  LEFT DIABETIC;  Surgeon: Nevada Crane, MD;  Location: [facility];  Service: Ophthalmology;  Laterality: Left;  Diabetic - oral meds sleep apnea  . FOOT FRACTURE SURGERY Left   . NASAL TURBINATE REDUCTION    . TENNIS ELBOW RELEASE/NIRSCHEL PROCEDURE Left 05/23/2015   Procedure: TENNIS ELBOW RELEASE/NIRSCHEL PROCEDURE;  Surgeon: Erin Sons, MD;  Location: [facility];  Service: Orthopedics;  Laterality: Left;  CPAP AND DIABETIC  . TONSILLECTOMY    . TRIGGER FINGER RELEASE Bilateral 2005     No current facility-administered medications for this encounter.   Current Outpatient Medications:  .  aspirin 81 MG tablet, Take 81 mg by mouth daily. AM, Disp: , Rfl:  .  atorvastatin (LIPITOR) 20 MG tablet, Take 20 mg by mouth daily. AM, Disp: , Rfl:  .  clonazePAM (KLONOPIN) 1 MG tablet, Take 1 mg by mouth as needed. , Disp: , Rfl:  .  FLUoxetine (PROZAC) 40 MG capsule, Take 40 mg by mouth daily. AM, Disp: , Rfl:  .  Linagliptin-Metformin HCl 2.11-998 MG TABS, Take by mouth 2 (two) times daily. AM AND PM, Disp: , Rfl:  .  lisinopril (PRINIVIL,ZESTRIL) 20 MG tablet, Take 20 mg by mouth daily., Disp: , Rfl:  .  pioglitazone (ACTOS) 30 MG tablet, Take 30 mg by mouth daily. AM, Disp: , Rfl:  .  pramipexole (MIRAPEX) 1 MG tablet, Take 2 mg by mouth daily. PM, Disp: , Rfl:  .  sitaGLIPtin (JANUVIA) 25 MG tablet,  Take 25 mg daily by mouth., Disp: , Rfl:  .  benzonatate (TESSALON PERLES) 100 MG capsule, Take 1 capsule (100 mg total) by mouth 3 (three) times daily as needed for cough., Disp: 15 capsule, Rfl: 0 .  chlorpheniramine-HYDROcodone (TUSSIONEX PENNKINETIC ER) 10-8 MG/5ML SUER, Take 5 mLs by mouth at bedtime as needed for cough. do not drive or operate machinery while taking as can cause drowsiness., Disp: 50 mL, Rfl: 0 .  doxycycline (VIBRAMYCIN) 100 MG capsule, Take 1 capsule (100 mg total) by mouth 2  (two) times daily., Disp: 20 capsule, Rfl: 0  Allergies Patient has no known allergies.  History reviewed. No pertinent family history.  Social History Social History   Tobacco Use  . Smoking status: Never Smoker  . Smokeless tobacco: Never Used  Substance Use Topics  . Alcohol use: Yes    Comment: 1/ MONTH WINE  . Drug use: No    Review of Systems Constitutional: No fever/chills ENT: No sore throat. Cardiovascular: Denies chest pain. Respiratory: Denies shortness of breath. Gastrointestinal: No abdominal pain.  No nausea, no vomiting.  Musculoskeletal: Negative for back pain. Skin: Negative for rash.   ____________________________________________   PHYSICAL EXAM:  VITAL SIGNS: ED Triage Vitals  Enc Vitals Group     BP 04/10/18 1016 123/82     Pulse Rate 04/10/18 1016 67     Resp 04/10/18 1016 16     Temp 04/10/18 1016 98.2 F (36.8 C)     Temp Source 04/10/18 1016 Oral     SpO2 04/10/18 1016 97 %     Weight 04/10/18 1013 195 lb (88.5 kg)     Height 04/10/18 1013 5\' 9"  (1.753 m)     Head Circumference --      Peak Flow --      Pain Score 04/10/18 1013 0     Pain Loc --      Pain Edu? --      Excl. in GC? --     Constitutional: Alert and oriented. Well appearing and in no acute distress. Eyes: Conjunctivae are normal.  Head: Atraumatic. No sinus tenderness to palpation. No swelling. No erythema.  Ears: no erythema, normal TMs bilaterally.   Nose:No nasal congestion   Mouth/Throat: Mucous membranes are moist. No pharyngeal erythema. No tonsillar swelling or exudate.  Neck: No stridor.  No cervical spine tenderness to palpation. Hematological/Lymphatic/Immunilogical: No cervical lymphadenopathy. Cardiovascular: Normal rate, regular rhythm. Grossly normal heart sounds.  Good peripheral circulation. Respiratory: Normal respiratory effort.  No retractions. No wheezes, rales or rhonchi. Good air movement.  Musculoskeletal: Ambulatory with steady gait. No  cervical, thoracic or lumbar tenderness to palpation. Neurologic:  Normal speech and language. No gait instability. Skin:  Skin appears warm, dry and intact. No rash noted. Psychiatric: Mood and affect are normal. Speech and behavior are normal.  ___________________________________________   LABS (all labs ordered are listed, but only abnormal results are displayed)  Labs Reviewed - No data to display ____________________________________________  PROCEDURES Procedures    INITIAL IMPRESSION / ASSESSMENT AND PLAN / ED COURSE  Pertinent labs & imaging results that were available during my care of the patient were reviewed by me and considered in my medical decision making (see chart for details).  Well-appearing patient.  No acute distress.  Suspect recent viral upper respiratory infection.  With symptoms continuing and age and risk factors will empirically start patient on oral doxycycline.  PRN Tessalon Perles and Tussionex.  Encourage rest, fluids, supportive care.Discussed indication,  risks and benefits of medications with patient.  Discussed follow up with Primary care physician this week. Discussed follow up and return parameters including no resolution or any worsening concerns. Patient verbalized understanding and agreed to plan.   ____________________________________________   FINAL CLINICAL IMPRESSION(S) / ED DIAGNOSES  Final diagnoses:  Cough     ED Discharge Orders         Ordered    doxycycline (VIBRAMYCIN) 100 MG capsule  2 times daily     04/10/18 1032    benzonatate (TESSALON PERLES) 100 MG capsule  3 times daily PRN     04/10/18 1032    chlorpheniramine-HYDROcodone (TUSSIONEX PENNKINETIC ER) 10-8 MG/5ML SUER  At bedtime PRN     04/10/18 1032           Note: This dictation was prepared with Dragon dictation along with smaller phrase technology. Any transcriptional errors that result from this process are unintentional.         Renford Dills,  NP 04/10/18 1134

## 2018-04-10 NOTE — ED Triage Notes (Signed)
Patient c/o cough and chest congestion for a week.  Patient denies fevers.  °

## 2018-04-10 NOTE — Discharge Instructions (Signed)
Take medication as prescribed. Rest. Drink plenty of fluids.  ° °Follow up with your primary care physician this week as needed. Return to Urgent care for new or worsening concerns.  ° °

## 2018-05-06 ENCOUNTER — Emergency Department: Payer: Medicare Other

## 2018-05-06 ENCOUNTER — Inpatient Hospital Stay
Admission: EM | Admit: 2018-05-06 | Discharge: 2018-05-07 | DRG: 392 | Disposition: A | Discharge status: Home / Self Care | Payer: Medicare Other | Attending: Internal Medicine | Admitting: Internal Medicine

## 2018-05-06 ENCOUNTER — Encounter: Payer: Self-pay | Admitting: Emergency Medicine

## 2018-05-06 DIAGNOSIS — Z9842 Cataract extraction status, left eye: Secondary | ICD-10-CM | POA: Diagnosis not present

## 2018-05-06 DIAGNOSIS — Z9841 Cataract extraction status, right eye: Secondary | ICD-10-CM

## 2018-05-06 DIAGNOSIS — R079 Chest pain, unspecified: Secondary | ICD-10-CM | POA: Diagnosis present

## 2018-05-06 DIAGNOSIS — E1165 Type 2 diabetes mellitus with hyperglycemia: Secondary | ICD-10-CM | POA: Diagnosis present

## 2018-05-06 DIAGNOSIS — M19071 Primary osteoarthritis, right ankle and foot: Secondary | ICD-10-CM | POA: Diagnosis present

## 2018-05-06 DIAGNOSIS — F419 Anxiety disorder, unspecified: Secondary | ICD-10-CM | POA: Diagnosis present

## 2018-05-06 DIAGNOSIS — K219 Gastro-esophageal reflux disease without esophagitis: Secondary | ICD-10-CM | POA: Diagnosis present

## 2018-05-06 DIAGNOSIS — G4730 Sleep apnea, unspecified: Secondary | ICD-10-CM | POA: Diagnosis present

## 2018-05-06 DIAGNOSIS — M19012 Primary osteoarthritis, left shoulder: Secondary | ICD-10-CM | POA: Diagnosis present

## 2018-05-06 DIAGNOSIS — Z7984 Long term (current) use of oral hypoglycemic drugs: Secondary | ICD-10-CM | POA: Diagnosis not present

## 2018-05-06 DIAGNOSIS — M19072 Primary osteoarthritis, left ankle and foot: Secondary | ICD-10-CM | POA: Diagnosis present

## 2018-05-06 DIAGNOSIS — E785 Hyperlipidemia, unspecified: Secondary | ICD-10-CM | POA: Diagnosis present

## 2018-05-06 DIAGNOSIS — G2581 Restless legs syndrome: Secondary | ICD-10-CM | POA: Diagnosis present

## 2018-05-06 DIAGNOSIS — M19011 Primary osteoarthritis, right shoulder: Secondary | ICD-10-CM | POA: Diagnosis present

## 2018-05-06 DIAGNOSIS — Z981 Arthrodesis status: Secondary | ICD-10-CM | POA: Diagnosis not present

## 2018-05-06 DIAGNOSIS — I10 Essential (primary) hypertension: Secondary | ICD-10-CM | POA: Diagnosis present

## 2018-05-06 DIAGNOSIS — Z961 Presence of intraocular lens: Secondary | ICD-10-CM | POA: Diagnosis present

## 2018-05-06 DIAGNOSIS — Z7982 Long term (current) use of aspirin: Secondary | ICD-10-CM

## 2018-05-06 DIAGNOSIS — M170 Bilateral primary osteoarthritis of knee: Secondary | ICD-10-CM | POA: Diagnosis present

## 2018-05-06 DIAGNOSIS — E1169 Type 2 diabetes mellitus with other specified complication: Secondary | ICD-10-CM | POA: Diagnosis present

## 2018-05-06 DIAGNOSIS — E119 Type 2 diabetes mellitus without complications: Secondary | ICD-10-CM

## 2018-05-06 HISTORY — DX: Essential (primary) hypertension: I10

## 2018-05-06 HISTORY — DX: Type 2 diabetes mellitus without complications: E11.9

## 2018-05-06 HISTORY — DX: Type 2 diabetes mellitus with other specified complication: E78.5

## 2018-05-06 HISTORY — DX: Type 2 diabetes mellitus with other specified complication: E11.69

## 2018-05-06 LAB — BASIC METABOLIC PANEL
ANION GAP: 8 (ref 5–15)
BUN: 22 mg/dL (ref 8–23)
CHLORIDE: 101 mmol/L (ref 98–111)
CO2: 27 mmol/L (ref 22–32)
CREATININE: 0.77 mg/dL (ref 0.61–1.24)
Calcium: 9.1 mg/dL (ref 8.9–10.3)
GFR calc Af Amer: >60 mL/min (ref >60)
GFR calc non Af Amer: >60 mL/min (ref >60)
GLUCOSE: 258 mg/dL — AB (ref 70–99)
Potassium: 4.2 mmol/L (ref 3.5–5.1)
Sodium: 136 mmol/L (ref 135–145)

## 2018-05-06 LAB — CBC
HEMATOCRIT: 46.3 % (ref 39.0–52.0)
Hemoglobin: 15.3 g/dL (ref 13.0–17.0)
MCH: 29.4 pg (ref 26.0–34.0)
MCHC: 33 g/dL (ref 30.0–36.0)
MCV: 88.9 fL (ref 80.0–100.0)
NRBC: 0 % (ref 0.0–0.2)
Platelets: 187 10*3/uL (ref 150–400)
RBC: 5.21 MIL/uL (ref 4.22–5.81)
RDW: 12.8 % (ref 11.5–15.5)
WBC: 6.9 10*3/uL (ref 4.0–10.5)

## 2018-05-06 LAB — TROPONIN I: Troponin I: <0.03 ng/mL

## 2018-05-06 MED ORDER — NITROGLYCERIN 2 % TD OINT: 0.5000 [in_us] | TOPICAL_OINTMENT | Freq: Once | TRANSDERMAL | Status: DC

## 2018-05-06 MED ORDER — ASPIRIN 81 MG PO CHEW
324.0000 mg | CHEWABLE_TABLET | Freq: Once | ORAL | Status: AC
  Administered 2018-05-06: 324 mg via ORAL
  Filled 2018-05-06: qty 4

## 2018-05-06 NOTE — ED Provider Notes (Signed)
[facility] Emergency Department Provider Note  ____________________________________________   I have reviewed the triage vital signs and the nursing notes. Where available I have reviewed prior notes and, if possible and indicated, outside hospital notes.    HISTORY  Chief Complaint Chest Pain    HPI Alexander Hart is a 71 y.o. male  With a history of anxiety diabetes mellitus hyper tensive medication use, history of hyperlipidemia on daily aspirin with no prior history of CAD or ACS presents today complaining of substernal chest discomfort which was "not so good" but felt like gas.  Happened twice today.  The first time it happened at rest after he been working in his shop.  That went away after 2 hours.  He had some mild nausea but no diaphoresis with it.  He has no abdominal pain.  Is a substernal chest pressure.  The second time it happened and it radiated to his left arm.  Again at rest.  He states the pain is gone at this time.  Nothing makes it better nothing makes it worse, he did again have some mild nausea with it.  He denies any fever or chills.  He states he is actually had this pain off and on for the last couple months but never thought anything of it.  This is a first have not been this bad.  It is a substernal pressure like "gas pain", went away on its own, no other alleviating or aggravating factors, no other radiation, did not go to his back, gradual in onset, states "it was hurting pretty good there for a while". Is not pleuritic.    Past Medical History:  Diagnosis Date  . Anxiety    CONTROLLED ON PROZAC  . Diabetes mellitus without complication (HCC)    TYPE 2  . Epicondylitis syndrome of elbow    LEFT, ARTHRITIS ANKLES,SHOULDERS,KNEES  . Hyperlipidemia   . Motion sickness    CAR  . Restless leg syndrome   . Sleep apnea    S/P T&A AND SINUS SURGERY, USES CPAP    There are no active problems to display for this patient.   Past  Surgical History:  Procedure Laterality Date  . ANTERIOR FUSION CERVICAL SPINE     C5-6  . CATARACT EXTRACTION W/PHACO Right 05/09/2017   Procedure: CATARACT EXTRACTION PHACO AND INTRAOCULAR LENS PLACEMENT (IOC) RIGHT DIABETIC;  Surgeon: Nevada Crane, MD;  Location: [facility];  Service: Ophthalmology;  Laterality: Right;  Diabetic - oral meds sleep apnea  . CATARACT EXTRACTION W/PHACO Left 06/21/2017   Procedure: CATARACT EXTRACTION PHACO AND INTRAOCULAR LENS PLACEMENT (IOC)  LEFT DIABETIC;  Surgeon: Nevada Crane, MD;  Location: [facility];  Service: Ophthalmology;  Laterality: Left;  Diabetic - oral meds sleep apnea  . FOOT FRACTURE SURGERY Left   . NASAL TURBINATE REDUCTION    . TENNIS ELBOW RELEASE/NIRSCHEL PROCEDURE Left 05/23/2015   Procedure: TENNIS ELBOW RELEASE/NIRSCHEL PROCEDURE;  Surgeon: Erin Sons, MD;  Location: [facility];  Service: Orthopedics;  Laterality: Left;  CPAP AND DIABETIC  . TONSILLECTOMY    . TRIGGER FINGER RELEASE Bilateral 2005    Prior to Admission medications   Medication Sig Start Date End Date Taking? Authorizing Provider  aspirin 81 MG tablet Take 81 mg by mouth daily. AM   Yes [provider]  atorvastatin (LIPITOR) 20 MG tablet Take 20 mg by mouth daily. AM   Yes [provider]  clonazePAM (KLONOPIN) 1 MG tablet Take  1 mg by mouth as needed.    Yes [provider]  FLUoxetine (PROZAC) 40 MG capsule Take 40 mg by mouth daily. AM   Yes [provider]  Linagliptin-Metformin HCl 2.11-998 MG TABS Take 1 tablet by mouth 2 (two) times daily. AM AND PM   Yes [provider]  lisinopril (PRINIVIL,ZESTRIL) 20 MG tablet Take 20 mg by mouth daily.   Yes [provider]  pioglitazone (ACTOS) 30 MG tablet Take 30 mg by mouth daily. AM   Yes [provider]  pramipexole (MIRAPEX) 1 MG tablet Take 2 mg by mouth daily. PM   Yes [provider]   sitaGLIPtin (JANUVIA) 25 MG tablet Take 25 mg daily by mouth.   Yes [provider]  benzonatate (TESSALON PERLES) 100 MG capsule Take 1 capsule (100 mg total) by mouth 3 (three) times daily as needed for cough. Patient not taking: Reported on 05/06/2018 04/10/18   Renford Dills, NP  chlorpheniramine-HYDROcodone [facility] ER) 10-8 MG/5ML SUER Take 5 mLs by mouth at bedtime as needed for cough. do not drive or operate machinery while taking as can cause drowsiness. Patient not taking: Reported on 05/06/2018 04/10/18   Renford Dills, NP  doxycycline (VIBRAMYCIN) 100 MG capsule Take 1 capsule (100 mg total) by mouth 2 (two) times daily. Patient not taking: Reported on 05/06/2018 04/10/18   Renford Dills, NP    Allergies Patient has no known allergies.  No family history on file.  Social History Social History   Tobacco Use  . Smoking status: Never Smoker  . Smokeless tobacco: Never Used  Substance Use Topics  . Alcohol use: Yes    Comment: 1/ MONTH WINE  . Drug use: No    Review of Systems Constitutional: No fever/chills Eyes: No visual changes. ENT: No sore throat. No stiff neck no neck pain Cardiovascular: + chest pain. Respiratory: Denies shortness of breath. Gastrointestinal:   no vomiting.  No diarrhea.  No constipation. Genitourinary: Negative for dysuria. Musculoskeletal: Negative lower extremity swelling Skin: Negative for rash. Neurological: Negative for severe headaches, focal weakness or numbness.   ____________________________________________   PHYSICAL EXAM:  VITAL SIGNS: ED Triage Vitals  Enc Vitals Group     BP 05/06/18 2157 129/65     Pulse Rate 05/06/18 2157 61     Resp 05/06/18 2157 18     Temp 05/06/18 2157 97.6 F (36.4 C)     Temp Source 05/06/18 2157 Oral     SpO2 05/06/18 2157 97 %     Weight 05/06/18 2158 194 lb (88 kg)     Height 05/06/18 2158 5\' 9"  (1.753 m)     Head Circumference --      Peak Flow --       Pain Score 05/06/18 2157 4     Pain Loc --      Pain Edu? --      Excl. in GC? --     Constitutional: Alert and oriented. Well appearing and in no acute distress. Eyes: Conjunctivae are normal Head: Atraumatic HEENT: No congestion/rhinnorhea. Mucous membranes are moist.  Oropharynx non-erythematous Neck:   Nontender with no meningismus, no masses, no stridor Cardiovascular: Normal rate, regular rhythm. Grossly normal heart sounds.  Good peripheral circulation. Respiratory: Normal respiratory effort.  No retractions. Lungs CTAB. Abdominal: Soft and light right upper quadrant tenderness otherwise nonsurgical abdomen with no pulsatile mass. No distention. No guarding no rebound Back:  There is no focal tenderness or step off.  there is no midline tenderness there are no lesions noted. there is no CVA tenderness Musculoskeletal: No lower extremity tenderness, no upper extremity tenderness. No joint effusions, no DVT signs strong distal pulses no edema Neurologic:  Normal speech and language. No gross focal neurologic deficits are appreciated.  Skin:  Skin is warm, dry and intact. No rash noted. Psychiatric: Mood and affect are normal. Speech and behavior are normal.  ____________________________________________   LABS (all labs ordered are listed, but only abnormal results are displayed)  Labs Reviewed  BASIC METABOLIC PANEL - Abnormal; Notable for the following components:      Result Value   Glucose, Bld 258 (*)    All other components within normal limits  CBC  TROPONIN I  HEPATIC FUNCTION PANEL  LIPASE, BLOOD    Pertinent labs  results that were available during my care of the patient were reviewed by me and considered in my medical decision making (see chart for details). ____________________________________________  EKG  I personally interpreted any EKGs ordered by me or triage Sinus rhythm, no acute ST elevation or depression no acute ischemic  changes ____________________________________________  RADIOLOGY  Pertinent labs & imaging results that were available during my care of the patient were reviewed by me and considered in my medical decision making (see chart for details). If possible, patient and/or family made aware of any abnormal findings.  Dg Chest 2 View  Result Date: 05/06/2018 CLINICAL DATA:  Left-sided chest pain radiating to left arm for 2 hours. Nausea. EXAM: CHEST - 2 VIEW COMPARISON:  01/19/2016 FINDINGS: The heart size and mediastinal contours are within normal limits. Both lungs are clear. Cervical spine fusion hardware again noted. IMPRESSION: No active cardiopulmonary disease. Electronically Signed   By: Myles Rosenthal M.D.   On: 05/06/2018 22:53   US Abdomen Limited Ruq  Result Date: 05/06/2018 CLINICAL DATA:  Chest pain. EXAM: ULTRASOUND ABDOMEN LIMITED RIGHT UPPER QUADRANT COMPARISON:  None. FINDINGS: Gallbladder: No gallstones or wall thickening visualized. No sonographic Murphy sign noted by sonographer. Common bile duct: Diameter: 3.3 mm, normal Liver: Heterogeneous increased parenchymal echotexture consistent with diffuse fatty infiltration with areas of fat sparing. No focal liver lesions otherwise identified. Portal vein is patent on color Doppler imaging with normal direction of blood flow towards the liver. IMPRESSION: No evidence of cholelithiasis or cholecystitis. Diffuse fatty infiltration of the liver. Electronically Signed   By: Burman Nieves M.D.   On: 05/06/2018 23:20   ____________________________________________    PROCEDURES  Procedure(s) performed: None  Procedures  Critical Care performed: None  ____________________________________________   INITIAL IMPRESSION / ASSESSMENT AND PLAN / ED COURSE  Pertinent labs & imaging results that were available during my care of the patient were reviewed by me and considered in my medical decision making (see chart for details).  Patient  here with substernal chest discomfort that has had initially radiated to his left arm.  His abdomen is benign.  Initially there was a very mild right upper quadrant abdominal pain which is now gone but I did do a gallbladder ultrasound which is negative.  Liver function test and lipase are.  His heart score is quite high although his work-up in the emergency room is reassuring given his age and comorbidities we will admit him to the hospital I have given him aspirin he has no ongoing pain I will give him nitroglycerin to forestall further discomfort and we will admit him.  I did talk to the hospitalist service and they agree with  plan.    ____________________________________________   FINAL CLINICAL IMPRESSION(S) / ED DIAGNOSES  Final diagnoses:  Chest pain  Chest pain, unspecified type      This chart was dictated using voice recognition software.  Despite best efforts to proofread,  errors can occur which can change meaning.      Jeanmarie Plant, MD 05/07/18 0002

## 2018-05-06 NOTE — ED Notes (Signed)
Call to lab to add on additional blood work that was ordered

## 2018-05-06 NOTE — ED Triage Notes (Signed)
Patient with complaint of left side chest pain radiating to his left arm times two hours. Patient states that he has also had some nausea. Denies shortness of breath.

## 2018-05-07 ENCOUNTER — Other Ambulatory Visit: Payer: Self-pay

## 2018-05-07 ENCOUNTER — Inpatient Hospital Stay: Admit: 2018-05-07 | Payer: Medicare Other

## 2018-05-07 ENCOUNTER — Encounter: Payer: Self-pay | Admitting: Cardiology

## 2018-05-07 DIAGNOSIS — M19072 Primary osteoarthritis, left ankle and foot: Secondary | ICD-10-CM | POA: Diagnosis not present

## 2018-05-07 DIAGNOSIS — M19012 Primary osteoarthritis, left shoulder: Secondary | ICD-10-CM | POA: Diagnosis not present

## 2018-05-07 DIAGNOSIS — E785 Hyperlipidemia, unspecified: Secondary | ICD-10-CM | POA: Diagnosis not present

## 2018-05-07 DIAGNOSIS — E119 Type 2 diabetes mellitus without complications: Secondary | ICD-10-CM | POA: Diagnosis not present

## 2018-05-07 DIAGNOSIS — Z961 Presence of intraocular lens: Secondary | ICD-10-CM | POA: Diagnosis not present

## 2018-05-07 DIAGNOSIS — K219 Gastro-esophageal reflux disease without esophagitis: Secondary | ICD-10-CM | POA: Diagnosis not present

## 2018-05-07 DIAGNOSIS — I10 Essential (primary) hypertension: Secondary | ICD-10-CM | POA: Diagnosis present

## 2018-05-07 DIAGNOSIS — R079 Chest pain, unspecified: Secondary | ICD-10-CM | POA: Diagnosis present

## 2018-05-07 DIAGNOSIS — E1169 Type 2 diabetes mellitus with other specified complication: Secondary | ICD-10-CM | POA: Diagnosis present

## 2018-05-07 DIAGNOSIS — Z7984 Long term (current) use of oral hypoglycemic drugs: Secondary | ICD-10-CM | POA: Diagnosis not present

## 2018-05-07 DIAGNOSIS — Z7982 Long term (current) use of aspirin: Secondary | ICD-10-CM | POA: Diagnosis not present

## 2018-05-07 DIAGNOSIS — E1165 Type 2 diabetes mellitus with hyperglycemia: Secondary | ICD-10-CM | POA: Diagnosis not present

## 2018-05-07 DIAGNOSIS — F419 Anxiety disorder, unspecified: Secondary | ICD-10-CM | POA: Diagnosis not present

## 2018-05-07 DIAGNOSIS — Z9842 Cataract extraction status, left eye: Secondary | ICD-10-CM | POA: Diagnosis not present

## 2018-05-07 DIAGNOSIS — Z9841 Cataract extraction status, right eye: Secondary | ICD-10-CM | POA: Diagnosis not present

## 2018-05-07 DIAGNOSIS — M19071 Primary osteoarthritis, right ankle and foot: Secondary | ICD-10-CM | POA: Diagnosis not present

## 2018-05-07 DIAGNOSIS — G2581 Restless legs syndrome: Secondary | ICD-10-CM | POA: Diagnosis not present

## 2018-05-07 DIAGNOSIS — M19011 Primary osteoarthritis, right shoulder: Secondary | ICD-10-CM | POA: Diagnosis not present

## 2018-05-07 DIAGNOSIS — Z981 Arthrodesis status: Secondary | ICD-10-CM | POA: Diagnosis not present

## 2018-05-07 DIAGNOSIS — M170 Bilateral primary osteoarthritis of knee: Secondary | ICD-10-CM | POA: Diagnosis not present

## 2018-05-07 DIAGNOSIS — G4730 Sleep apnea, unspecified: Secondary | ICD-10-CM | POA: Diagnosis not present

## 2018-05-07 LAB — CBC
HCT: 41.1 % (ref 39.0–52.0)
Hemoglobin: 13.6 g/dL (ref 13.0–17.0)
MCH: 29 pg (ref 26.0–34.0)
MCHC: 33.1 g/dL (ref 30.0–36.0)
MCV: 87.6 fL (ref 80.0–100.0)
NRBC: 0 % (ref 0.0–0.2)
PLATELETS: 170 10*3/uL (ref 150–400)
RBC: 4.69 MIL/uL (ref 4.22–5.81)
RDW: 13 % (ref 11.5–15.5)
WBC: 5.5 10*3/uL (ref 4.0–10.5)

## 2018-05-07 LAB — HEPATIC FUNCTION PANEL
ALK PHOS: 100 U/L (ref 38–126)
ALT: 30 U/L (ref 0–44)
AST: 20 U/L (ref 15–41)
Albumin: 3.8 g/dL (ref 3.5–5.0)
Bilirubin, Direct: <0.1 mg/dL (ref 0.0–0.2)
Total Bilirubin: 0.7 mg/dL (ref 0.3–1.2)
Total Protein: 7.1 g/dL (ref 6.5–8.1)

## 2018-05-07 LAB — BASIC METABOLIC PANEL
Anion gap: 9 (ref 5–15)
BUN: 19 mg/dL (ref 8–23)
CALCIUM: 8.4 mg/dL — AB (ref 8.9–10.3)
CO2: 24 mmol/L (ref 22–32)
CREATININE: 0.55 mg/dL — AB (ref 0.61–1.24)
Chloride: 103 mmol/L (ref 98–111)
GFR calc non Af Amer: >60 mL/min (ref >60)
Glucose, Bld: 221 mg/dL — ABNORMAL HIGH (ref 70–99)
Potassium: 3.8 mmol/L (ref 3.5–5.1)
SODIUM: 136 mmol/L (ref 135–145)

## 2018-05-07 LAB — TROPONIN I
Troponin I: <0.03 ng/mL (ref <0.03)
Troponin I: <0.03 ng/mL (ref <0.03)

## 2018-05-07 LAB — GLUCOSE, CAPILLARY
GLUCOSE-CAPILLARY: 222 mg/dL — AB (ref 70–99)
Glucose-Capillary: 200 mg/dL — ABNORMAL HIGH (ref 70–99)

## 2018-05-07 LAB — LIPASE, BLOOD: Lipase: 111 U/L — ABNORMAL HIGH (ref 11–51)

## 2018-05-07 MED ORDER — ACETAMINOPHEN 325 MG PO TABS: 650.0000 mg | ORAL_TABLET | Freq: Four times a day (QID) | ORAL | Status: DC | PRN

## 2018-05-07 MED ORDER — HEPARIN SODIUM (PORCINE) 5000 UNIT/ML IJ SOLN
5000.0000 [IU] | Freq: Three times a day (TID) | INTRAMUSCULAR | Status: DC
  Administered 2018-05-07: 5000 [IU] via SUBCUTANEOUS
  Filled 2018-05-07: qty 1

## 2018-05-07 MED ORDER — ATORVASTATIN CALCIUM 20 MG PO TABS: 20.0000 mg | ORAL_TABLET | Freq: Every day | ORAL | Status: DC

## 2018-05-07 MED ORDER — DOCUSATE SODIUM 100 MG PO CAPS
100.0000 mg | ORAL_CAPSULE | Freq: Two times a day (BID) | ORAL | Status: DC
  Administered 2018-05-07: 100 mg via ORAL
  Filled 2018-05-07: qty 1

## 2018-05-07 MED ORDER — CLONAZEPAM 1 MG PO TABS: 1.0000 mg | ORAL_TABLET | Freq: Every day | ORAL | Status: DC | PRN

## 2018-05-07 MED ORDER — HYDROCODONE-ACETAMINOPHEN 5-325 MG PO TABS: 1.0000 | ORAL_TABLET | ORAL | Status: DC | PRN

## 2018-05-07 MED ORDER — LISINOPRIL 20 MG PO TABS
20.0000 mg | ORAL_TABLET | Freq: Every day | ORAL | Status: DC
  Administered 2018-05-07: 20 mg via ORAL
  Filled 2018-05-07: qty 1

## 2018-05-07 MED ORDER — ONDANSETRON HCL 4 MG PO TABS: 4.0000 mg | ORAL_TABLET | Freq: Four times a day (QID) | ORAL | Status: DC | PRN

## 2018-05-07 MED ORDER — PRAMIPEXOLE DIHYDROCHLORIDE 1 MG PO TABS
2.0000 mg | ORAL_TABLET | Freq: Every day | ORAL | Status: DC
  Administered 2018-05-07: 2 mg via ORAL
  Filled 2018-05-07: qty 2

## 2018-05-07 MED ORDER — LINAGLIPTIN 5 MG PO TABS
5.0000 mg | ORAL_TABLET | Freq: Every day | ORAL | Status: DC
  Administered 2018-05-07: 5 mg via ORAL
  Filled 2018-05-07: qty 1

## 2018-05-07 MED ORDER — ONDANSETRON HCL 4 MG/2ML IJ SOLN: 4.0000 mg | Freq: Four times a day (QID) | INTRAMUSCULAR | Status: DC | PRN

## 2018-05-07 MED ORDER — TRAZODONE HCL 50 MG PO TABS: 25.0000 mg | ORAL_TABLET | Freq: Every evening | ORAL | Status: DC | PRN

## 2018-05-07 MED ORDER — PANTOPRAZOLE SODIUM 40 MG PO TBEC: 40.0000 mg | DELAYED_RELEASE_TABLET | Freq: Every day | ORAL | Status: DC

## 2018-05-07 MED ORDER — PIOGLITAZONE HCL 30 MG PO TABS
30.0000 mg | ORAL_TABLET | Freq: Every day | ORAL | Status: DC
  Administered 2018-05-07: 30 mg via ORAL
  Filled 2018-05-07: qty 1

## 2018-05-07 MED ORDER — ASPIRIN EC 81 MG PO TBEC
81.0000 mg | DELAYED_RELEASE_TABLET | Freq: Every day | ORAL | Status: DC
  Administered 2018-05-07: 81 mg via ORAL
  Filled 2018-05-07: qty 1

## 2018-05-07 MED ORDER — INSULIN ASPART 100 UNIT/ML ~~LOC~~ SOLN
0.0000 [IU] | Freq: Three times a day (TID) | SUBCUTANEOUS | Status: DC
  Administered 2018-05-07: 5 [IU] via SUBCUTANEOUS
  Filled 2018-05-07: qty 1

## 2018-05-07 MED ORDER — INSULIN ASPART 100 UNIT/ML ~~LOC~~ SOLN: 0.0000 [IU] | Freq: Every day | SUBCUTANEOUS | Status: DC

## 2018-05-07 MED ORDER — ACETAMINOPHEN 650 MG RE SUPP: 650.0000 mg | Freq: Four times a day (QID) | RECTAL | Status: DC | PRN

## 2018-05-07 MED ORDER — FLUOXETINE HCL 20 MG PO CAPS
40.0000 mg | ORAL_CAPSULE | Freq: Every day | ORAL | Status: DC
  Administered 2018-05-07: 40 mg via ORAL
  Filled 2018-05-07: qty 2

## 2018-05-07 MED ORDER — BISACODYL 5 MG PO TBEC: 5.0000 mg | DELAYED_RELEASE_TABLET | Freq: Every day | ORAL | Status: DC | PRN

## 2018-05-07 NOTE — Discharge Instructions (Signed)

## 2018-05-07 NOTE — Consult Note (Signed)
Cardiology Consultation:   Patient ID: BROCKTON MCKESSON MRN: [id number]; DOB: 1946/07/17  Admit date: 05/06/2018 Date of Consult: 05/07/2018  Primary [facility] Provider: [facility] Primary Cardiologist: No primary care provider on file. new Primary Electrophysiologist:  None   Patient Profile:   Alexander Hart is a 72 y.o. male with a hx of HTN, HLD, DM-2  who is being seen today for the evaluation of CHEST PAN at the request of Alexander Saran, MD.  History of Present Illness:   Alexander Hart was in his usual state of health until yesterday and feeling station in the central chest that lasted several hours.  Was not exacerbated by any activity such as walking or any other strenuous activity.  No associated dyspnea.  May be some queasiness but no real nausea.  Symptoms were dissipated and then shortly after eating a small dinner (nothing bad -just a light dinner) he again began having the same symptoms at this time associated with some discomfort in the left forearm.  The combination of the symptoms made him concerned and therefore they came to the emergency room for evaluation. He is quick to point out that over the last week or so he is been doing lots of heavy lifting and and exertion with his upper body discomfort could be related to that.  Other cardiac review of symptoms as follows:Cardiovascular ROS: positive for - chest pain negative for - dyspnea on exertion, edema, irregular heartbeat, loss of consciousness, orthopnea, palpitations, paroxysmal nocturnal dyspnea, rapid heart rate, shortness of breath or Near syncope, TIA/amaurosis fugax.   Past Medical History:  Diagnosis Date  . Anxiety    CONTROLLED ON PROZAC  . Diabetes mellitus without complication (HCC)    TYPE 2  . Epicondylitis syndrome of elbow    LEFT, ARTHRITIS ANKLES,SHOULDERS,KNEES  . Essential hypertension   . Hyperlipidemia   . Hyperlipidemia associated with type 2 diabetes mellitus (HCC)   . Motion  sickness    CAR  . Restless leg syndrome   . Sleep apnea    S/P T&A AND SINUS SURGERY, USES CPAP    Past Surgical History:  Procedure Laterality Date  . ANTERIOR FUSION CERVICAL SPINE     C5-6  . CATARACT EXTRACTION W/PHACO Right 05/09/2017   Procedure: CATARACT EXTRACTION PHACO AND INTRAOCULAR LENS PLACEMENT (IOC) RIGHT DIABETIC;  Surgeon: Alexander Crane, MD;  Location: [facility];  Service: Ophthalmology;  Laterality: Right;  Diabetic - oral meds sleep apnea  . CATARACT EXTRACTION W/PHACO Left 06/21/2017   Procedure: CATARACT EXTRACTION PHACO AND INTRAOCULAR LENS PLACEMENT (IOC)  LEFT DIABETIC;  Surgeon: Alexander Crane, MD;  Location: [facility];  Service: Ophthalmology;  Laterality: Left;  Diabetic - oral meds sleep apnea  . FOOT FRACTURE SURGERY Left   . NASAL TURBINATE REDUCTION    . TENNIS ELBOW RELEASE/NIRSCHEL PROCEDURE Left 05/23/2015   Procedure: TENNIS ELBOW RELEASE/NIRSCHEL PROCEDURE;  Surgeon: Alexander Sons, MD;  Location: [facility];  Service: Orthopedics;  Laterality: Left;  CPAP AND DIABETIC  . TONSILLECTOMY    . TRIGGER FINGER RELEASE Bilateral 2005     Home Medications:  Prior to Admission medications   Medication Sig Start Date End Date Taking? Authorizing Provider  aspirin 81 MG tablet Take 81 mg by mouth daily. AM   Yes [provider]  atorvastatin (LIPITOR) 20 MG tablet Take 20 mg by mouth daily. AM   Yes [provider]  clonazePAM (KLONOPIN) 1 MG tablet Take 1 mg by  mouth as needed.    Yes [provider]  FLUoxetine (PROZAC) 40 MG capsule Take 40 mg by mouth daily. AM   Yes [provider]  Linagliptin-Metformin HCl 2.11-998 MG TABS Take 1 tablet by mouth 2 (two) times daily. AM AND PM   Yes [provider]  lisinopril (PRINIVIL,ZESTRIL) 20 MG tablet Take 20 mg by mouth daily.   Yes [provider]  pioglitazone (ACTOS) 30 MG tablet Take 30 mg by mouth daily. AM   Yes  [provider]  pramipexole (MIRAPEX) 1 MG tablet Take 2 mg by mouth daily. PM   Yes [provider]  sitaGLIPtin (JANUVIA) 25 MG tablet Take 25 mg daily by mouth.   Yes [provider]  benzonatate (TESSALON PERLES) 100 MG capsule Take 1 capsule (100 mg total) by mouth 3 (three) times daily as needed for cough. Patient not taking: Reported on 05/06/2018 04/10/18   Alexander Dills, NP  chlorpheniramine-HYDROcodone [facility]) 10-8 MG/5ML SUER Take 5 mLs by mouth at bedtime as needed for cough. do not drive or operate machinery while taking as can cause drowsiness. Patient not taking: Reported on 05/06/2018 04/10/18   Alexander Dills, NP  doxycycline (VIBRAMYCIN) 100 MG capsule Take 1 capsule (100 mg total) by mouth 2 (two) times daily. Patient not taking: Reported on 05/06/2018 04/10/18   Alexander Dills, NP    Inpatient Medications: Scheduled Meds: . aspirin EC  81 mg Oral Daily  . atorvastatin  20 mg Oral Daily  . docusate sodium  100 mg Oral BID  . FLUoxetine  40 mg Oral Daily  . heparin  5,000 Units Subcutaneous Q8H  . insulin aspart  0-15 Units Subcutaneous TID WC  . insulin aspart  0-5 Units Subcutaneous QHS  . linagliptin  5 mg Oral Daily  . lisinopril  20 mg Oral Daily  . pantoprazole  40 mg Oral Daily  . pioglitazone  30 mg Oral Daily  . pramipexole  2 mg Oral Daily   Continuous Infusions:  PRN Meds: acetaminophen **OR** acetaminophen, bisacodyl, clonazePAM, HYDROcodone-acetaminophen, ondansetron **OR** ondansetron (ZOFRAN) IV, traZODone  Allergies:   No Known Allergies  Social History:   Social History   Tobacco Use  . Smoking status: Never Smoker  . Smokeless tobacco: Never Used  Substance Use Topics  . Alcohol use: Yes    Comment: 1/ MONTH WINE  . Drug use: No   Social History   Social History Narrative  . Not on file     Family History:  --He denies any family history of any premature coronary disease or other  heart disease.    ROS: Please see the history of present illness.  Review of Systems  Constitutional: Negative for chills, fever, malaise/fatigue and weight loss.  HENT: Negative for congestion and nosebleeds.   Respiratory: Negative for cough, shortness of breath and wheezing.   Gastrointestinal: Positive for abdominal pain and heartburn. Negative for blood in stool, constipation, diarrhea and melena.  Genitourinary: Negative for hematuria.  Musculoskeletal: Positive for joint pain (OA pains). Negative for falls.  Neurological: Negative.  Negative for dizziness, focal weakness and headaches.  All other systems reviewed and are negative.    Physical Exam/Data:   Vitals:   05/07/18 0145 05/07/18 0209 05/07/18 0535 05/07/18 0852  BP: 117/78 124/80 123/71 (!) 123/51  Pulse: (!) 51 (!) 58 61 73  Resp: 12   19  Temp:  (!) 97.5 F (36.4 C) (!) 97.5 F (36.4 C)   TempSrc:  Oral Oral   SpO2: 96% 96% 97% 94%  Weight:  88.7 kg    Height:  5\' 9"  (1.753 m)      Intake/Output Summary (Last 24 hours) at 05/07/2018 0959 Last data filed at 05/07/2018 0946 Gross per 24 hour  Intake 120 ml  Output 1000 ml  Net -880 ml   Filed Weights   05/06/18 2158 05/07/18 0209  Weight: 88 kg 88.7 kg   Body mass index is 28.89 kg/m.  General:  Well nourished, well developed, in no acute distress; resting comfortably in bed HEENT: [place]/AT.  No LAN, JVD  No thyromegaly. Vascular: No carotid bruits; FA pulses 2+ bilaterally without bruits  Cardiac:  normal S1, S2; RRR; no murmur; nondisplaced PMI. Lungs:  clear to auscultation bilaterally, no wheezing, rhonchi or rales  Abd: soft, nontender, no hepatomegaly  Ext: no edema Musculoskeletal:  No deformities, BUE and BLE strength normal and equal Skin: warm and dry  Neuro:  CNs 2-12 intact, no focal abnormalities noted Psych:  Normal affect   EKG:  The EKG was personally reviewed and demonstrates: Sinus rhythm rate 63 bpm.  Normal EKG. Telemetry:   Telemetry was personally reviewed and demonstrates: Sinus rhythm in 60s and 70s.  Relevant CV Studies: None  Laboratory Data:  Chemistry Recent Labs  Lab 05/06/18 2207 05/07/18 0429  NA 136 136  K 4.2 3.8  CL 101 103  CO2 27 24  GLUCOSE 258* 221*  BUN 22 19  CREATININE 0.77 0.55*  CALCIUM 9.1 8.4*  GFRNONAA >60 >60  GFRAA >60 >60  ANIONGAP 8 9    Recent Labs  Lab 05/06/18 2207  PROT 7.1  ALBUMIN 3.8  AST 20  ALT 30  ALKPHOS 100  BILITOT 0.7   Hematology Recent Labs  Lab 05/06/18 2207 05/07/18 0429  WBC 6.9 5.5  RBC 5.21 4.69  HGB 15.3 13.6  HCT 46.3 41.1  MCV 88.9 87.6  MCH 29.4 29.0  MCHC 33.0 33.1  RDW 12.8 13.0  PLT 187 170   Cardiac Enzymes Recent Labs  Lab 05/06/18 2207 05/07/18 0429  TROPONINI <0.03 <0.03   No results for input(s): TROPIPOC in the last 168 hours.  BNPNo results for input(s): BNP, PROBNP in the last 168 hours.  DDimer No results for input(s): DDIMER in the last 168 hours.  Radiology/Studies:  Dg Chest 2 View  Result Date: 05/06/2018 CLINICAL DATA:  Left-sided chest pain radiating to left arm for 2 hours. Nausea. EXAM: CHEST - 2 VIEW COMPARISON:  01/19/2016 FINDINGS: The heart size and mediastinal contours are within normal limits. Both lungs are clear. Cervical spine fusion hardware again noted. IMPRESSION: No active cardiopulmonary disease. Electronically Signed   By: Myles Rosenthal M.D.   On: 05/06/2018 22:53   US Abdomen Limited Ruq  Result Date: 05/06/2018 CLINICAL DATA:  Chest pain. EXAM: ULTRASOUND ABDOMEN LIMITED RIGHT UPPER QUADRANT COMPARISON:  None. FINDINGS: Gallbladder: No gallstones or wall thickening visualized. No sonographic Murphy sign noted by sonographer. Common bile duct: Diameter: 3.3 mm, normal Liver: Heterogeneous increased parenchymal echotexture consistent with diffuse fatty infiltration with areas of fat sparing. No focal liver lesions otherwise identified. Portal vein is patent on color Doppler imaging  with normal direction of blood flow towards the liver. IMPRESSION: No evidence of cholelithiasis or cholecystitis. Diffuse fatty infiltration of the liver. Electronically Signed   By: Burman Nieves M.D.   On: 05/06/2018 23:20    Assessment and Plan:   Principal Problem:   Chest pain with  moderate risk for cardiac etiology Active Problems:   Hyperlipidemia associated with type 2 diabetes mellitus (HCC)   Diabetes mellitus type II, non insulin dependent (HCC)   Essential hypertension  Mr. Alpert presents with symptoms that sound most consistent with GERD, however given his age and risk factors and the fact that there was some associated with arm pain cannot exclude ischemic etiology. He has ruled out for MI I think is safe to be discharged home today with plan for outpatient nuclear stress test.  We will contact him via the office this week to set this up.   [facility] will sign off.   Medication Recommendations: Continue current medications with aspirin, statin and ACE inhibitor. Other recommendations (labs, testing, etc): [facility] will arrange outpatient nuclear stress test Follow up as an outpatient: Will be arranged following stress test.  For questions or updates, please contact [facility] Please consult [email or web address] for contact info under     Signed, Bryan Lemma, MD  05/07/2018 9:59 AM

## 2018-05-07 NOTE — Progress Notes (Signed)
Family Meeting Note  Advance Directive:yes  Today a meeting took place with the Patient.and spouse  The following clinical team members were present during this meeting:MD  The following were discussed:Patient's diagnosis chest pain: , Patient's progosis: > 12 months and Goals for treatment: Full Code  Additional follow-up to be provided:none at this time Time spent during discussion:16 minutes  Latrenda Irani, MD

## 2018-05-07 NOTE — Discharge Summary (Signed)
[facility] - [place] at [facility]   PATIENT NAME: Alexander Hart    MR#:  [id number]  DATE OF BIRTH:  01-11-1946  DATE OF ADMISSION:  05/06/2018 ADMITTING PHYSICIAN: Cammy Copa, MD  DATE OF DISCHARGE: 05/07/2018  PRIMARY CARE PHYSICIAN: [place]    ADMISSION DIAGNOSIS:  Chest pain [R07.9] Chest pain, unspecified type [R07.9]  DISCHARGE DIAGNOSIS:  Active Problems:   Chest pain   SECONDARY DIAGNOSIS:   Past Medical History:  Diagnosis Date  . Anxiety    CONTROLLED ON PROZAC  . Diabetes mellitus without complication (HCC)    TYPE 2  . Epicondylitis syndrome of elbow    LEFT, ARTHRITIS ANKLES,SHOULDERS,KNEES  . Hyperlipidemia   . Motion sickness    CAR  . Restless leg syndrome   . Sleep apnea    S/P T&A AND SINUS SURGERY, USES CPAP    HOSPITAL COURSE:    72 year old male with history of diabetes who presents with chest pain.  1.  Chest pain: Patient was ruled out for ACS.  He was eval by cardiology.  He will need outpatient stress test.  2.  Diabetes: Patient will continue outpatient regimen with ADA diet  3.  Essential hypertension: Continue lisinopril  4.  Hyperlipidemia: Patient will continue Lipitor    DISCHARGE CONDITIONS AND DIET:   Stable for discharge on heart healthy diabetic diet  CONSULTS OBTAINED:  Treatment Team:  Marykay Lex, MD  DRUG ALLERGIES:  No Known Allergies  DISCHARGE MEDICATIONS:   Allergies as of 05/07/2018   No Known Allergies     Medication List    STOP taking these medications   benzonatate 100 MG capsule Commonly known as:  TESSALON   chlorpheniramine-HYDROcodone 10-8 MG/5ML Suer Commonly known as:  TUSSIONEX   doxycycline 100 MG capsule Commonly known as:  VIBRAMYCIN     TAKE these medications   aspirin 81 MG tablet Take 81 mg by mouth daily. AM   atorvastatin 20 MG tablet Commonly known as:  LIPITOR Take 20 mg by mouth daily. AM   clonazePAM 1 MG  tablet Commonly known as:  KLONOPIN Take 1 mg by mouth as needed.   FLUoxetine 40 MG capsule Commonly known as:  PROZAC Take 40 mg by mouth daily. AM   linaGLIPtin-metFORMIN HCl 2.11-998 MG Tabs Take 1 tablet by mouth 2 (two) times daily. AM AND PM   lisinopril 20 MG tablet Commonly known as:  PRINIVIL,ZESTRIL Take 20 mg by mouth daily.   pioglitazone 30 MG tablet Commonly known as:  ACTOS Take 30 mg by mouth daily. AM   pramipexole 1 MG tablet Commonly known as:  MIRAPEX Take 2 mg by mouth daily. PM   sitaGLIPtin 25 MG tablet Commonly known as:  JANUVIA Take 25 mg daily by mouth.         Today   CHIEF COMPLAINT:  Patient without chest pain this morning and doing well   VITAL SIGNS:  Blood pressure (!) 123/51, pulse 73, temperature (!) 97.5 F (36.4 C), temperature source Oral, resp. rate 19, height 5\' 9"  (1.753 m), weight 88.7 kg, SpO2 94 %.   REVIEW OF SYSTEMS:  Review of Systems  Constitutional: Negative.  Negative for chills, fever and malaise/fatigue.  HENT: Negative.  Negative for ear discharge, ear pain, hearing loss, nosebleeds and sore throat.   Eyes: Negative.  Negative for blurred vision and pain.  Respiratory: Negative.  Negative for cough, hemoptysis, shortness of breath and wheezing.   Cardiovascular: Negative.  Negative for chest pain, palpitations and leg swelling.  Gastrointestinal: Negative.  Negative for abdominal pain, blood in stool, diarrhea, nausea and vomiting.  Genitourinary: Negative.  Negative for dysuria.  Musculoskeletal: Negative.  Negative for back pain.  Skin: Negative.   Neurological: Negative for dizziness, tremors, speech change, focal weakness, seizures and headaches.  Endo/Heme/Allergies: Negative.  Does not bruise/bleed easily.  Psychiatric/Behavioral: Negative.  Negative for depression, hallucinations and suicidal ideas.     PHYSICAL EXAMINATION:  GENERAL:  72 y.o.-year-old patient lying in the bed with no acute  distress.  NECK:  Supple, no jugular venous distention. No thyroid enlargement, no tenderness.  LUNGS: Normal breath sounds bilaterally, no wheezing, rales,rhonchi  No use of accessory muscles of respiration.  CARDIOVASCULAR: S1, S2 normal. No murmurs, rubs, or gallops.  ABDOMEN: Soft, non-tender, non-distended. Bowel sounds present. No organomegaly or mass.  EXTREMITIES: No pedal edema, cyanosis, or clubbing.  PSYCHIATRIC: The patient is alert and oriented x 3.  SKIN: No obvious rash, lesion, or ulcer.   DATA REVIEW:   CBC Recent Labs  Lab 05/07/18 0429  WBC 5.5  HGB 13.6  HCT 41.1  PLT 170    Chemistries  Recent Labs  Lab 05/06/18 2207 05/07/18 0429  NA 136 136  K 4.2 3.8  CL 101 103  CO2 27 24  GLUCOSE 258* 221*  BUN 22 19  CREATININE 0.77 0.55*  CALCIUM 9.1 8.4*  AST 20  --   ALT 30  --   ALKPHOS 100  --   BILITOT 0.7  --     Cardiac Enzymes Recent Labs  Lab 05/06/18 2207 05/07/18 0429  TROPONINI <0.03 <0.03    Microbiology Results  @[email or web address] @  RADIOLOGY:  Dg Chest 2 View  Result Date: 05/06/2018 CLINICAL DATA:  Left-sided chest pain radiating to left arm for 2 hours. Nausea. EXAM: CHEST - 2 VIEW COMPARISON:  01/19/2016 FINDINGS: The heart size and mediastinal contours are within normal limits. Both lungs are clear. Cervical spine fusion hardware again noted. IMPRESSION: No active cardiopulmonary disease. Electronically Signed   By: Myles Rosenthal M.D.   On: 05/06/2018 22:53   US Abdomen Limited Ruq  Result Date: 05/06/2018 CLINICAL DATA:  Chest pain. EXAM: ULTRASOUND ABDOMEN LIMITED RIGHT UPPER QUADRANT COMPARISON:  None. FINDINGS: Gallbladder: No gallstones or wall thickening visualized. No sonographic Murphy sign noted by sonographer. Common bile duct: Diameter: 3.3 mm, normal Liver: Heterogeneous increased parenchymal echotexture consistent with diffuse fatty infiltration with areas of fat sparing. No focal liver lesions otherwise identified. Portal  vein is patent on color Doppler imaging with normal direction of blood flow towards the liver. IMPRESSION: No evidence of cholelithiasis or cholecystitis. Diffuse fatty infiltration of the liver. Electronically Signed   By: Burman Nieves M.D.   On: 05/06/2018 23:20      Allergies as of 05/07/2018   No Known Allergies     Medication List    STOP taking these medications   benzonatate 100 MG capsule Commonly known as:  TESSALON   chlorpheniramine-HYDROcodone 10-8 MG/5ML Suer Commonly known as:  TUSSIONEX   doxycycline 100 MG capsule Commonly known as:  VIBRAMYCIN     TAKE these medications   aspirin 81 MG tablet Take 81 mg by mouth daily. AM   atorvastatin 20 MG tablet Commonly known as:  LIPITOR Take 20 mg by mouth daily. AM   clonazePAM 1 MG tablet Commonly known as:  KLONOPIN Take 1 mg by mouth as needed.   FLUoxetine 40 MG capsule Commonly  known as:  PROZAC Take 40 mg by mouth daily. AM   linaGLIPtin-metFORMIN HCl 2.11-998 MG Tabs Take 1 tablet by mouth 2 (two) times daily. AM AND PM   lisinopril 20 MG tablet Commonly known as:  PRINIVIL,ZESTRIL Take 20 mg by mouth daily.   pioglitazone 30 MG tablet Commonly known as:  ACTOS Take 30 mg by mouth daily. AM   pramipexole 1 MG tablet Commonly known as:  MIRAPEX Take 2 mg by mouth daily. PM   sitaGLIPtin 25 MG tablet Commonly known as:  JANUVIA Take 25 mg daily by mouth.         Management plans discussed with the patient and he is in agreement. Stable for discharge   Patient should follow up with cardiology  CODE STATUS:     Code Status Orders  (From admission, onward)         Start     Ordered   05/07/18 0207  Full code  Continuous     05/07/18 0207        Code Status History    This patient has a current code status but no historical code status.      TOTAL TIME TAKING CARE OF THIS PATIENT: 38 minutes.    Note: This dictation was prepared with Dragon dictation along with  smaller phrase technology. Any transcriptional errors that result from this process are unintentional.  Kadyn Chovan M.D on 05/07/2018 at 9:15 AM  Between 7am to 6pm - Pager - [phone] After 6pm go to [email or web address] - Social research officer, government  [facility]  Office  [phone]  CC: Primary care physician; [facility]

## 2018-05-07 NOTE — H&P (Signed)
[facility] Physicians - [place] at [facility]   PATIENT NAME: Alexander Hart    MR#:  [id number]  DATE OF BIRTH:  May 01, 1946  DATE OF ADMISSION:  05/06/2018  PRIMARY CARE PHYSICIAN: Dan Humphreys, [facility]   REQUESTING/REFERRING PHYSICIAN:   CHIEF COMPLAINT:   Chief Complaint  Patient presents with  . Chest Pain    HISTORY OF PRESENT ILLNESS: Alexander Hart  is a 72 y.o. male with a known history of hypertension, diabetes, hyperlipidemia and tobacco abuse in the past. Patient presented to emergency room for intermittent retrosternal chest pressure going on for the past few hours, gradually getting worse.  Patient thought it might be related to heartburn, because his symptoms got worse after eating.  Mild nausea and diaphoresis were associated with the chest pain.  During the most recent episode, just prior to arrival to emergency room, the chest pain radiated to the left arm.  This was very concerning to the patient and so he decided to come to the hospital for evaluation. He is currently chest pain-free, in the emergency room during my examination.  He received aspirin and nitroglycerin in the emergency room. Blood test done emergency room, including CBC and CMP are grossly unremarkable except for elevated blood sugar at 258.  First troponin level is lower than 0.03.  Chest x-ray is unremarkable. EKG shows normal sinus rhythm with heart rate to 72 bpm; no acute ischemic changes. Patient is admitted for ACS rule out.   PAST MEDICAL HISTORY:   Past Medical History:  Diagnosis Date  . Anxiety    CONTROLLED ON PROZAC  . Diabetes mellitus without complication (HCC)    TYPE 2  . Epicondylitis syndrome of elbow    LEFT, ARTHRITIS ANKLES,SHOULDERS,KNEES  . Hyperlipidemia   . Motion sickness    CAR  . Restless leg syndrome   . Sleep apnea    S/P T&A AND SINUS SURGERY, USES CPAP    PAST SURGICAL HISTORY:  Past Surgical History:  Procedure Laterality Date  .  ANTERIOR FUSION CERVICAL SPINE     C5-6  . CATARACT EXTRACTION W/PHACO Right 05/09/2017   Procedure: CATARACT EXTRACTION PHACO AND INTRAOCULAR LENS PLACEMENT (IOC) RIGHT DIABETIC;  Surgeon: Nevada Crane, MD;  Location: [facility];  Service: Ophthalmology;  Laterality: Right;  Diabetic - oral meds sleep apnea  . CATARACT EXTRACTION W/PHACO Left 06/21/2017   Procedure: CATARACT EXTRACTION PHACO AND INTRAOCULAR LENS PLACEMENT (IOC)  LEFT DIABETIC;  Surgeon: Nevada Crane, MD;  Location: [facility];  Service: Ophthalmology;  Laterality: Left;  Diabetic - oral meds sleep apnea  . FOOT FRACTURE SURGERY Left   . NASAL TURBINATE REDUCTION    . TENNIS ELBOW RELEASE/NIRSCHEL PROCEDURE Left 05/23/2015   Procedure: TENNIS ELBOW RELEASE/NIRSCHEL PROCEDURE;  Surgeon: Erin Sons, MD;  Location: [facility];  Service: Orthopedics;  Laterality: Left;  CPAP AND DIABETIC  . TONSILLECTOMY    . TRIGGER FINGER RELEASE Bilateral 2005    SOCIAL HISTORY:  Social History   Tobacco Use  . Smoking status: Never Smoker  . Smokeless tobacco: Never Used  Substance Use Topics  . Alcohol use: Yes    Comment: 1/ MONTH WINE    FAMILY HISTORY: No family history on file.  DRUG ALLERGIES: No Known Allergies  REVIEW OF SYSTEMS:   CONSTITUTIONAL: No fever, fatigue or weakness.  EYES: No changes in vision.  EARS, NOSE, AND THROAT: No tinnitus or ear pain.  RESPIRATORY: No cough, shortness of breath, wheezing or  hemoptysis.  CARDIOVASCULAR: Positive for chest pain, no orthopnea or edema.  GASTROINTESTINAL: No nausea, vomiting, diarrhea or abdominal pain.  GENITOURINARY: No dysuria, hematuria.  ENDOCRINE: No polyuria, nocturia. HEMATOLOGY: No bleeding. SKIN: No rash or lesion. MUSCULOSKELETAL: No joint pain at this time.   NEUROLOGIC: No focal weakness.  PSYCHIATRY: No anxiety or depression.   MEDICATIONS AT HOME:  Prior to Admission medications   Medication Sig Start  Date End Date Taking? Authorizing Provider  aspirin 81 MG tablet Take 81 mg by mouth daily. AM   Yes [provider]  atorvastatin (LIPITOR) 20 MG tablet Take 20 mg by mouth daily. AM   Yes [provider]  clonazePAM (KLONOPIN) 1 MG tablet Take 1 mg by mouth as needed.    Yes [provider]  FLUoxetine (PROZAC) 40 MG capsule Take 40 mg by mouth daily. AM   Yes [provider]  Linagliptin-Metformin HCl 2.11-998 MG TABS Take 1 tablet by mouth 2 (two) times daily. AM AND PM   Yes [provider]  lisinopril (PRINIVIL,ZESTRIL) 20 MG tablet Take 20 mg by mouth daily.   Yes [provider]  pioglitazone (ACTOS) 30 MG tablet Take 30 mg by mouth daily. AM   Yes [provider]  pramipexole (MIRAPEX) 1 MG tablet Take 2 mg by mouth daily. PM   Yes [provider]  sitaGLIPtin (JANUVIA) 25 MG tablet Take 25 mg daily by mouth.   Yes [provider]  benzonatate (TESSALON PERLES) 100 MG capsule Take 1 capsule (100 mg total) by mouth 3 (three) times daily as needed for cough. Patient not taking: Reported on 05/06/2018 04/10/18   Renford Dills, NP  chlorpheniramine-HYDROcodone [facility]) 10-8 MG/5ML SUER Take 5 mLs by mouth at bedtime as needed for cough. do not drive or operate machinery while taking as can cause drowsiness. Patient not taking: Reported on 05/06/2018 04/10/18   Renford Dills, NP  doxycycline (VIBRAMYCIN) 100 MG capsule Take 1 capsule (100 mg total) by mouth 2 (two) times daily. Patient not taking: Reported on 05/06/2018 04/10/18   Renford Dills, NP      PHYSICAL EXAMINATION:   VITAL SIGNS: Blood pressure 118/69, pulse 60, temperature 97.6 F (36.4 C), temperature source Oral, resp. rate 18, height 5\' 9"  (1.753 m), weight 88 kg, SpO2 91 %.  GENERAL:  72 y.o.-year-old patient lying in the bed with no acute distress.  EYES: Pupils equal, round, reactive to light and accommodation. No  scleral icterus. Extraocular muscles intact.  HEENT: Head atraumatic, normocephalic. Oropharynx and nasopharynx clear.  NECK:  Supple, no jugular venous distention. No thyroid enlargement, no tenderness.  LUNGS: Normal breath sounds bilaterally, no wheezing, rales,rhonchi or crepitation. No use of accessory muscles of respiration.  CARDIOVASCULAR: S1, S2 normal. No S3/S4.  ABDOMEN: Soft, nontender, nondistended. Bowel sounds present. No organomegaly or mass.  EXTREMITIES: No pedal edema, cyanosis, or clubbing.  NEUROLOGIC: Cranial nerves II through XII are intact. Muscle strength 5/5 in all extremities. Sensation intact.   PSYCHIATRIC: The patient is alert and oriented x 3.  SKIN: No obvious rash, lesion, or ulcer.   LABORATORY PANEL:   CBC Recent Labs  Lab 05/06/18 2207  WBC 6.9  HGB 15.3  HCT 46.3  PLT 187  MCV 88.9  MCH 29.4  MCHC 33.0  RDW 12.8   ------------------------------------------------------------------------------------------------------------------  Chemistries  Recent Labs  Lab 05/06/18 2207  NA 136  K 4.2  CL 101  CO2 27  GLUCOSE 258*  BUN 22  CREATININE 0.77  CALCIUM 9.1   ------------------------------------------------------------------------------------------------------------------ estimated creatinine clearance is 91.6 mL/min (by C-G formula based on SCr of 0.77 mg/dL). ------------------------------------------------------------------------------------------------------------------ No results for input(s): TSH, T4TOTAL, T3FREE, THYROIDAB in the last 72 hours.  Invalid input(s): FREET3   Coagulation profile No results for input(s): INR, PROTIME in the last 168 hours. ------------------------------------------------------------------------------------------------------------------- No results for input(s): DDIMER in the last 72  hours. -------------------------------------------------------------------------------------------------------------------  Cardiac Enzymes Recent Labs  Lab 05/06/18 2207  TROPONINI <0.03   ------------------------------------------------------------------------------------------------------------------ Invalid input(s): POCBNP  ---------------------------------------------------------------------------------------------------------------  Urinalysis No results found for: COLORURINE, APPEARANCEUR, LABSPEC, PHURINE, GLUCOSEU, HGBUR, BILIRUBINUR, KETONESUR, PROTEINUR, UROBILINOGEN, NITRITE, LEUKOCYTESUR   RADIOLOGY: Dg Chest 2 View  Result Date: 05/06/2018 CLINICAL DATA:  Left-sided chest pain radiating to left arm for 2 hours. Nausea. EXAM: CHEST - 2 VIEW COMPARISON:  01/19/2016 FINDINGS: The heart size and mediastinal contours are within normal limits. Both lungs are clear. Cervical spine fusion hardware again noted. IMPRESSION: No active cardiopulmonary disease. Electronically Signed   By: Myles Rosenthal M.D.   On: 05/06/2018 22:53   US Abdomen Limited Ruq  Result Date: 05/06/2018 CLINICAL DATA:  Chest pain. EXAM: ULTRASOUND ABDOMEN LIMITED RIGHT UPPER QUADRANT COMPARISON:  None. FINDINGS: Gallbladder: No gallstones or wall thickening visualized. No sonographic Murphy sign noted by sonographer. Common bile duct: Diameter: 3.3 mm, normal Liver: Heterogeneous increased parenchymal echotexture consistent with diffuse fatty infiltration with areas of fat sparing. No focal liver lesions otherwise identified. Portal vein is patent on color Doppler imaging with normal direction of blood flow towards the liver. IMPRESSION: No evidence of cholelithiasis or cholecystitis. Diffuse fatty infiltration of the liver. Electronically Signed   By: Burman Nieves M.D.   On: 05/06/2018 23:20    EKG: Orders placed or performed during the hospital encounter of 05/06/18  . EKG 12-Lead  . EKG 12-Lead  . ED  EKG within 10 minutes  . ED EKG within 10 minutes    IMPRESSION AND PLAN:  1.  Chest pain, will rule out acute coronary syndrome.  Continue aspirin.  Continue to monitor patient on telemetry and follow troponin levels.  Will check 2D echo to further evaluate the cardiac function.  Cardiology is consulted for further evaluation and treatment. 2.  Diabetes type 2.  Will start low-carb diet and monitor blood sugars before meals and at bedtime.  Will use insulin treatment during the hospital stay. 3.  Hypertension, stable, resume home medications. 4.  Hyperlipidemia, on statin.  All the records are reviewed and case discussed with ED provider. Management plans discussed with the patient, family and they are in agreement.  CODE STATUS: Full    TOTAL TIME TAKING CARE OF THIS PATIENT: 45 minutes.    Cammy Copa M.D on 05/07/2018 at 12:48 AM  Between 7am to 6pm - Pager - [phone]  After 6pm go to [email or web address] - password [email or web address] [facility] Physicians [place] at [facility]  [phone]  CC: Primary care physician; [facility]

## 2018-05-08 ENCOUNTER — Telehealth: Payer: Self-pay | Admitting: *Deleted

## 2018-05-08 DIAGNOSIS — R079 Chest pain, unspecified: Secondary | ICD-10-CM

## 2018-05-08 NOTE — Telephone Encounter (Signed)
Patient discharged from [facility] yesterday. Patient needs lexiscan per Dr Harding/Chris Brion Aliment, NP for chest pain. Called patient and left message to call us back. Order for [facility] entered.

## 2018-05-08 NOTE — Telephone Encounter (Signed)
Patient called back. He is agreeable to the [place]. He verbalized understanding of the appointment date and time and the pre-procedural instructions listed below. Message sent to Pre-cert.  Patient contacted regarding discharge from [facility] on 05/07/18.   Patient understands to follow up with provider ? On 06/06/18 at 0800am at [place].  Patient understands discharge instructions? Yes  Patient understands medications and regiment? Yes Patient understands to bring all medications to this visit? Yes     [facility] LEXISCAN MYOVIEW  Your caregiver has ordered a Stress Test with nuclear imaging. The purpose of this test is to evaluate the blood supply to your heart muscle. This procedure is referred to as a "Non-Invasive Stress Test." This is because other than having an IV started in your vein, nothing is inserted or "invades" your body. Cardiac stress tests are done to find areas of poor blood flow to the heart by determining the extent of coronary artery disease (CAD). Some patients exercise on a treadmill, which naturally increases the blood flow to your heart, while others who are  unable to walk on a treadmill due to physical limitations have a pharmacologic/chemical stress agent called Lexiscan . This medicine will mimic walking on a treadmill by temporarily increasing your coronary blood flow.   Please note: these test may take anywhere between 2-4 hours to complete  PLEASE REPORT TO [facility] MEDICAL MALL ENTRANCE  THE VOLUNTEERS AT THE FIRST DESK WILL DIRECT YOU WHERE TO GO  Date of Procedure:______10/17/19_________  Arrival Time for Procedure:______07:15 AM__________  Instructions regarding medication:   _XX_ : Hold diabetes medication morning of procedure  Januvia, Actos, Linagliptin-Metformin   PLEASE NOTIFY THE OFFICE AT LEAST 24 HOURS IN ADVANCE IF YOU ARE UNABLE TO KEEP YOUR APPOINTMENT.  [phone] AND  PLEASE NOTIFY NUCLEAR MEDICINE AT [facility] AT LEAST 24 HOURS IN ADVANCE IF YOU  ARE UNABLE TO KEEP YOUR APPOINTMENT. [phone]  How to prepare for your Myoview test:  1. Do not eat or drink after midnight 2. No caffeine for 24 hours prior to test 3. No smoking 24 hours prior to test. 4. Your medication may be taken with water.  If your doctor stopped a medication because of this test, do not take that medication. 5. Pants are appropriate. Please wear a short sleeve shirt. 6. No perfume, cologne or lotion. 7. Wear comfortable walking shoes.

## 2018-05-11 ENCOUNTER — Encounter
Admission: RE | Admit: 2018-05-11 | Discharge: 2018-05-11 | Disposition: A | Discharge status: Home / Self Care | Payer: Medicare Other | Source: Ambulatory Visit | Attending: Nurse Practitioner | Admitting: Nurse Practitioner

## 2018-05-11 DIAGNOSIS — R079 Chest pain, unspecified: Secondary | ICD-10-CM | POA: Diagnosis present

## 2018-05-11 LAB — NM MYOCAR MULTI W/SPECT W/WALL MOTION / EF
CHL CUP RESTING HR STRESS: 58 {beats}/min
LV dias vol: 104 mL (ref 62–150)
LVSYSVOL: 40 mL
MPHR: 148 {beats}/min
Peak HR: 89 {beats}/min
Percent HR: 60 %
TID: 0.96

## 2018-05-11 MED ORDER — TECHNETIUM TC 99M TETROFOSMIN IV KIT
32.6500 | PACK | Freq: Once | INTRAVENOUS | Status: AC | PRN
  Administered 2018-05-11: 32.65 via INTRAVENOUS

## 2018-05-11 MED ORDER — REGADENOSON 0.4 MG/5ML IV SOLN
0.4000 mg | Freq: Once | INTRAVENOUS | Status: AC
  Administered 2018-05-11: 0.4 mg via INTRAVENOUS

## 2018-05-11 MED ORDER — TECHNETIUM TC 99M TETROFOSMIN IV KIT
11.4600 | PACK | Freq: Once | INTRAVENOUS | Status: AC | PRN
  Administered 2018-05-11: 11.46 via INTRAVENOUS

## 2018-06-05 NOTE — Progress Notes (Deleted)
Cardiology Office Note Date:  06/05/2018  Patient ID:  Alexander Hart, Alexander Hart December 27, 1945, MRN [id number] PCP:  [facility]  Cardiologist:  Dr. ***, MD  ***refresh   Chief Complaint: Hospital follow up  History of Present Illness: Alexander Hart is a 72 y.o. male with history of DM2, HTN, HLD, sleep apnea on CPAP and anxiety who presents for hospital follow up after recent admission to [facility] from 10/13 to 10/13 for atypical chest pain.   Patient was admitted to [facility] on 10/*** with atypical chest pain. Pain was not exertional. Episode of pain that prompted him to come in for evaluation was in the setting of eating a light dinner. Cardiac enzymes were negative. EKG was not acute. CXR without cardiopulmonary disease. RUQ ultrasound was without evidence of cholelithiasis with diffuse fatty infiltration of the liver noted. Symptoms were consistent with GERD. Given he was admitted over a weekend, outpatient stress was advised as he did have cardiac risk factors. Nuclear stress test on 05/11/2018 was without significant ischemia or scar, LVEF 55-65%, low risk study.   ***  Past Medical History:  Diagnosis Date  . Anxiety    CONTROLLED ON PROZAC  . Diabetes mellitus type II, non insulin dependent (HCC)   . Epicondylitis syndrome of elbow    LEFT, ARTHRITIS ANKLES,SHOULDERS,KNEES  . Essential hypertension   . Hyperlipidemia   . Hyperlipidemia associated with type 2 diabetes mellitus (HCC)   . Motion sickness    CAR  . Restless leg syndrome   . Sleep apnea    S/P T&A AND SINUS SURGERY, USES CPAP    Past Surgical History:  Procedure Laterality Date  . ANTERIOR FUSION CERVICAL SPINE     C5-6  . CATARACT EXTRACTION W/PHACO Right 05/09/2017   Procedure: CATARACT EXTRACTION PHACO AND INTRAOCULAR LENS PLACEMENT (IOC) RIGHT DIABETIC;  Surgeon: Nevada Crane, MD;  Location: [facility];  Service: Ophthalmology;  Laterality: Right;  Diabetic - oral meds sleep apnea  .  CATARACT EXTRACTION W/PHACO Left 06/21/2017   Procedure: CATARACT EXTRACTION PHACO AND INTRAOCULAR LENS PLACEMENT (IOC)  LEFT DIABETIC;  Surgeon: Nevada Crane, MD;  Location: [facility];  Service: Ophthalmology;  Laterality: Left;  Diabetic - oral meds sleep apnea  . FOOT FRACTURE SURGERY Left   . NASAL TURBINATE REDUCTION    . TENNIS ELBOW RELEASE/NIRSCHEL PROCEDURE Left 05/23/2015   Procedure: TENNIS ELBOW RELEASE/NIRSCHEL PROCEDURE;  Surgeon: Erin Sons, MD;  Location: [facility];  Service: Orthopedics;  Laterality: Left;  CPAP AND DIABETIC  . TONSILLECTOMY    . TRIGGER FINGER RELEASE Bilateral 2005    No outpatient medications have been marked as taking for the 06/06/18 encounter (Appointment) with Sondra Barges, PA-C.    Allergies:   Patient has no known allergies.   Social History:  The patient  reports that he has never smoked. He has never used smokeless tobacco. He reports that he drinks alcohol. He reports that he does not use drugs.   Family History:  The patient's family history is not on file.  ROS:   ROS   PHYSICAL EXAM: *** VS:  There were no vitals taken for this visit. BMI: There is no height or weight on file to calculate BMI.  Physical Exam   EKG:  Was ordered and interpreted by me today. Shows ***  Recent Labs: 05/06/2018: ALT 30 05/07/2018: BUN 19; Creatinine, Ser 0.55; Hemoglobin 13.6; Platelets 170; Potassium 3.8; Sodium 136  No results found for  requested labs within last 8760 hours.   CrCl cannot be calculated (Patient's most recent lab result is older than the maximum 21 days allowed.).   Wt Readings from Last 3 Encounters:  05/07/18 195 lb 9.6 oz (88.7 kg)  04/10/18 195 lb (88.5 kg)  12/12/17 195 lb (88.5 kg)     Other studies reviewed: Additional studies/records reviewed today include: summarized above  ASSESSMENT AND PLAN:  1. ***  Disposition: F/u with Dr. Marland Kitchen or an APP in ***  Current medicines are  reviewed at length with the patient today.  The patient did not have any concerns regarding medicines.  Signed, Eula Listen, PA-C 06/05/2018 7:01 AM     [facility] [place] [address] [place], [place] [address] [phone]

## 2018-06-06 ENCOUNTER — Ambulatory Visit: Payer: Medicare Other | Admitting: Physician Assistant

## 2018-06-06 ENCOUNTER — Encounter

## 2018-07-27 ENCOUNTER — Encounter: Payer: Self-pay | Admitting: Gynecology

## 2018-07-27 ENCOUNTER — Other Ambulatory Visit: Payer: Self-pay

## 2018-07-27 ENCOUNTER — Ambulatory Visit
Admission: EM | Admit: 2018-07-27 | Discharge: 2018-07-27 | Disposition: A | Discharge status: Home / Self Care | Payer: Medicare Other | Attending: Family Medicine | Admitting: Family Medicine

## 2018-07-27 DIAGNOSIS — J209 Acute bronchitis, unspecified: Secondary | ICD-10-CM | POA: Insufficient documentation

## 2018-07-27 MED ORDER — DOXYCYCLINE HYCLATE 100 MG PO CAPS: 100.0000 mg | ORAL_CAPSULE | Freq: Two times a day (BID) | ORAL | 0 refills | Status: DC

## 2018-07-27 MED ORDER — HYDROCOD POLST-CPM POLST ER 10-8 MG/5ML PO SUER: 5.0000 mL | Freq: Every evening | ORAL | 0 refills | Status: DC | PRN

## 2018-07-27 NOTE — Discharge Instructions (Addendum)
Rest. Fluids.  Medication as prescribed.   Take care  Dr. Mariano Doshi  

## 2018-07-27 NOTE — ED Provider Notes (Signed)
[facility]    CSN: [id number] Arrival date & time: 07/27/18  1210  History   Chief Complaint Cough  HPI   73 year old male presents with cough and postnasal drip.  1 week history of cough.  Worsening.  Associated with intermittent.  No fever.  No medications or interventions tried.  He states the cough is severe and worse at night.  Productive.  No known relieving factors.  No other associated symptoms.  No other complaints.  PMH, Surgical Hx, Family Hx, Social History reviewed and updated as below.  Past Medical History:  Diagnosis Date  . Anxiety    CONTROLLED ON PROZAC  . Diabetes mellitus type II, non insulin dependent (HCC)   . Epicondylitis syndrome of elbow    LEFT, ARTHRITIS ANKLES,SHOULDERS,KNEES  . Essential hypertension   . Hyperlipidemia   . Hyperlipidemia associated with type 2 diabetes mellitus (HCC)   . Motion sickness    CAR  . Restless leg syndrome   . Sleep apnea    S/P T&A AND SINUS SURGERY, USES CPAP    Patient Active Problem List   Diagnosis Date Noted  . Chest pain with moderate risk for cardiac etiology 05/07/2018  . Hyperlipidemia associated with type 2 diabetes mellitus (HCC)   . Diabetes mellitus type II, non insulin dependent (HCC)   . Essential hypertension     Past Surgical History:  Procedure Laterality Date  . ANTERIOR FUSION CERVICAL SPINE     C5-6  . CATARACT EXTRACTION W/PHACO Right 05/09/2017   Procedure: CATARACT EXTRACTION PHACO AND INTRAOCULAR LENS PLACEMENT (IOC) RIGHT DIABETIC;  Surgeon: Nevada CraneKing, Bradley Mark, MD;  [facility];  Service: Ophthalmology;  Laterality: Right;  Diabetic - oral meds sleep apnea  . CATARACT EXTRACTION W/PHACO Left 06/21/2017   Procedure: CATARACT EXTRACTION PHACO AND INTRAOCULAR LENS PLACEMENT (IOC)  LEFT DIABETIC;  Surgeon: Nevada CraneKing, Bradley Mark, MD;  [facility];  Service: Ophthalmology;  Laterality: Left;  Diabetic - oral meds sleep apnea  . FOOT  FRACTURE SURGERY Left   . NASAL TURBINATE REDUCTION    . TENNIS ELBOW RELEASE/NIRSCHEL PROCEDURE Left 05/23/2015   Procedure: TENNIS ELBOW RELEASE/NIRSCHEL PROCEDURE;  Surgeon: Erin SonsHarold Kernodle, MD;  [facility];  Service: Orthopedics;  Laterality: Left;  CPAP AND DIABETIC  . TONSILLECTOMY    . TRIGGER FINGER RELEASE Bilateral 2005    Home Medications    Prior to Admission medications   Medication Sig Start Date End Date Taking? Authorizing Provider  aspirin 81 MG tablet Take 81 mg by mouth daily. AM   Yes [provider]  atorvastatin (LIPITOR) 20 MG tablet Take 20 mg by mouth daily. AM   Yes [provider]  clonazePAM (KLONOPIN) 1 MG tablet Take 1 mg by mouth as needed.    Yes [provider]  FLUoxetine (PROZAC) 40 MG capsule Take 40 mg by mouth daily. AM   Yes [provider]  Linagliptin-Metformin HCl 2.11-998 MG TABS Take 1 tablet by mouth 2 (two) times daily. AM AND PM   Yes [provider]  lisinopril (PRINIVIL,ZESTRIL) 20 MG tablet Take 20 mg by mouth daily.   Yes [provider]  pioglitazone (ACTOS) 30 MG tablet Take 30 mg by mouth daily. AM   Yes [provider]  pramipexole (MIRAPEX) 1 MG tablet Take 2 mg by mouth daily. PM   Yes [provider]  sitaGLIPtin (JANUVIA) 25 MG tablet Take 25 mg daily by mouth.   Yes [provider]  chlorpheniramine-HYDROcodone (TUSSIONEX PENNKINETIC ER) 10-8 MG/5ML SUER Take 5 mLs by mouth at bedtime as needed. 07/27/18   Tommie Sams, DO  doxycycline (VIBRAMYCIN) 100 MG capsule Take 1 capsule (100 mg total) by mouth 2 (two) times daily. 07/27/18   Tommie Sams, DO   Social History Social History   Tobacco Use  . Smoking status: Never Smoker  . Smokeless tobacco: Never Used  Substance Use Topics  . Alcohol use: Yes    Comment: 1/ MONTH WINE  . Drug use: No   Allergies   Patient has no known allergies.   Review of Systems Review of  Systems  HENT: Positive for postnasal drip.   Respiratory: Positive for cough.    Physical Exam Triage Vital Signs ED Triage Vitals  Enc Vitals Group     BP 07/27/18 1254 121/88     Pulse Rate 07/27/18 1254 71     Resp --      Temp 07/27/18 1254 98.2 F (36.8 C)     Temp Source 07/27/18 1254 Oral     SpO2 07/27/18 1254 98 %     Weight 07/27/18 1255 195 lb (88.5 kg)     Height 07/27/18 1255 5\' 9"  (1.753 m)     Head Circumference --      Peak Flow --      Pain Score 07/27/18 1255 0     Pain Loc --      Pain Edu? --      Excl. in GC? --    Updated Vital Signs BP 121/88 (BP Location: Left Arm)   Pulse 71   Temp 98.2 F (36.8 C) (Oral)   Ht 5\' 9"  (1.753 m)   Wt 88.5 kg   SpO2 98%   BMI 28.80 kg/m   Visual Acuity Right Eye Distance:   Left Eye Distance:   Bilateral Distance:    Right Eye Near:   Left Eye Near:    Bilateral Near:     Physical Exam Vitals signs and nursing note reviewed.  Constitutional:      General: He is not in acute distress. HENT:     Head: Normocephalic and atraumatic.     Mouth/Throat:     Pharynx: Oropharynx is clear. No posterior oropharyngeal erythema.  Cardiovascular:     Rate and Rhythm: Normal rate and regular rhythm.  Pulmonary:     Effort: Pulmonary effort is normal.     Comments: Diffuse wheezing. Neurological:     Mental Status: He is alert.  Psychiatric:        Mood and Affect: Mood normal.        Behavior: Behavior normal.    UC Treatments / Results  Labs (all labs ordered are listed, but only abnormal results are displayed) Labs Reviewed - No data to display  EKG None  Radiology No results found.  Procedures Procedures (including critical care time)  Medications Ordered in UC Medications - No data to display  Initial Impression / Assessment and Plan / UC Course  I have reviewed the triage vital signs and the nursing notes.  Pertinent labs & imaging results that were available during my care of the patient  were reviewed by me and considered in my medical decision making (see chart for details).    73 year old male presents with acute bronchitis.  Treating with doxycycline and Tussionex.  Final Clinical Impressions(s) / UC Diagnoses   Final diagnoses:  Acute bronchitis, unspecified organism     Discharge  Instructions     Rest. Fluids.  Medication as prescribed.  Take care  Dr. Adriana Simas    ED Prescriptions    Medication Sig Dispense Auth. Provider   doxycycline (VIBRAMYCIN) 100 MG capsule Take 1 capsule (100 mg total) by mouth 2 (two) times daily. 14 capsule [place], Sarcoxie G, DO   chlorpheniramine-HYDROcodone (TUSSIONEX PENNKINETIC ER) 10-8 MG/5ML SUER Take 5 mLs by mouth at bedtime as needed. 60 mL Tommie Sams, DO     Controlled Substance Prescriptions [place] Controlled Substance Registry consulted? Not Applicable   Tommie Sams, DO 07/27/18 1508

## 2018-07-27 NOTE — ED Triage Notes (Signed)
Patient c/o cough x 1 week.

## 2019-03-17 ENCOUNTER — Encounter: Payer: Self-pay | Admitting: Emergency Medicine

## 2019-03-17 ENCOUNTER — Ambulatory Visit
Admission: EM | Admit: 2019-03-17 | Discharge: 2019-03-17 | Disposition: A | Discharge status: Home / Self Care | Payer: Medicare Other | Attending: Emergency Medicine | Admitting: Emergency Medicine

## 2019-03-17 ENCOUNTER — Other Ambulatory Visit: Payer: Self-pay

## 2019-03-17 DIAGNOSIS — S61002A Unspecified open wound of left thumb without damage to nail, initial encounter: Secondary | ICD-10-CM | POA: Diagnosis not present

## 2019-03-17 DIAGNOSIS — W312XXA Contact with powered woodworking and forming machines, initial encounter: Secondary | ICD-10-CM

## 2019-03-17 DIAGNOSIS — Z23 Encounter for immunization: Secondary | ICD-10-CM

## 2019-03-17 MED ORDER — TETANUS-DIPHTHERIA TOXOIDS TD 5-2 LFU IM INJ
0.5000 mL | INJECTION | Freq: Once | INTRAMUSCULAR | Status: AC
  Administered 2019-03-17: 0.5 mL via INTRAMUSCULAR

## 2019-03-17 NOTE — Discharge Instructions (Signed)
Use over-the-counter triple antibiotic (Neosporin, etc.) to wound until the area begins to scab over. Keep covered as well.   Once area begins to scab, you don't have to use any ointments or treatments. Keep wound uncovered and dry during normal activities, but cover area if there's a chance it may get wet or dirty.

## 2019-03-17 NOTE — ED Triage Notes (Signed)
Patient states that he cut his left thumb on a table saw about 30 min ago today.  Patient has laceration to his left thumb.

## 2019-03-17 NOTE — ED Provider Notes (Signed)
[facility] - [place], [place]   Name: Alexander Hart DOB: Dec 10, 1945 MRN: [id number] CSN: [id number] PCP: [facility]  Arrival date and time:  03/17/19 2585  Chief Complaint:  Laceration (left thumb)   NOTE: Prior to seeing the patient today, I have reviewed the triage nursing documentation and vital signs. Clinical staff has updated patient's PMH/PSHx, current medication list, and drug allergies/intolerances to ensure comprehensive history available to assist in medical decision making.   History:   HPI: Alexander Hart is a 73 y.o. male who presents today with complaints of a laceration to his left thumb. Pt was using a table saw when his left thumb was caught by the saw. Pt covered the area and came straight to our facility. Pt has cut his finger with his table saw multiple times before. Last known tetanus in 2012.    Past Medical History:  Diagnosis Date   Anxiety    CONTROLLED ON PROZAC   Diabetes mellitus type II, non insulin dependent (HCC)    Epicondylitis syndrome of elbow    LEFT, ARTHRITIS ANKLES,SHOULDERS,KNEES   Essential hypertension    Hyperlipidemia    Hyperlipidemia associated with type 2 diabetes mellitus (HCC)    Motion sickness    CAR   Restless leg syndrome    Sleep apnea    S/P T&A AND SINUS SURGERY, USES CPAP    Past Surgical History:  Procedure Laterality Date   ANTERIOR FUSION CERVICAL SPINE     C5-6   CATARACT EXTRACTION W/PHACO Right 05/09/2017   Procedure: CATARACT EXTRACTION PHACO AND INTRAOCULAR LENS PLACEMENT ([place]) RIGHT DIABETIC;  Surgeon: Eulogio Bear, MD;  Location: [place];  Service: Ophthalmology;  Laterality: Right;  Diabetic - oral meds sleep apnea   CATARACT EXTRACTION W/PHACO Left 06/21/2017   Procedure: CATARACT EXTRACTION PHACO AND INTRAOCULAR LENS PLACEMENT ([place])  LEFT DIABETIC;  Surgeon: Eulogio Bear, MD;  Location: [place];  Service: Ophthalmology;   Laterality: Left;  Diabetic - oral meds sleep apnea   FOOT FRACTURE SURGERY Left    NASAL TURBINATE REDUCTION     TENNIS ELBOW RELEASE/NIRSCHEL PROCEDURE Left 05/23/2015   Procedure: TENNIS ELBOW RELEASE/NIRSCHEL PROCEDURE;  Surgeon: Leanor Kail, MD;  Location: [place];  Service: Orthopedics;  Laterality: Left;  CPAP AND DIABETIC   TONSILLECTOMY     TRIGGER FINGER RELEASE Bilateral 2005    History reviewed. No pertinent family history.  Social History   Tobacco Use   Smoking status: Never Smoker   Smokeless tobacco: Never Used  Substance Use Topics   Alcohol use: Yes    Comment: 1/ MONTH WINE   Drug use: No    Patient Active Problem List   Diagnosis Date Noted   Chest pain with moderate risk for cardiac etiology 05/07/2018   Hyperlipidemia associated with type 2 diabetes mellitus ([place])    Diabetes mellitus type II, non insulin dependent (HCC)    Essential hypertension     Home Medications:    Current Meds  Medication Sig   aspirin 81 MG tablet Take 81 mg by mouth daily. AM   atorvastatin (LIPITOR) 20 MG tablet Take 20 mg by mouth daily. AM   clonazePAM (KLONOPIN) 1 MG tablet Take 1 mg by mouth as needed.    FLUoxetine (PROZAC) 40 MG capsule Take 40 mg by mouth daily. AM   Linagliptin-Metformin HCl 2.11-998 MG TABS Take 1 tablet by mouth 2 (two) times daily. AM AND PM  lisinopril (PRINIVIL,ZESTRIL) 20 MG tablet Take 20 mg by mouth daily.   pioglitazone (ACTOS) 30 MG tablet Take 30 mg by mouth daily. AM   pramipexole (MIRAPEX) 1 MG tablet Take 2 mg by mouth daily. PM   sitaGLIPtin (JANUVIA) 25 MG tablet Take 25 mg daily by mouth.    Allergies:   Patient has no known allergies.  Review of Systems (ROS): Review of Systems  Skin: Positive for wound.  All other systems reviewed and are negative.    Vital Signs: Today's Vitals   03/17/19 0930 03/17/19 0932 03/17/19 0955  BP:  119/81   Pulse:  69   Resp:  16   Temp:  98.6 F  (37 C)   TempSrc:  Oral   SpO2:  98%   Weight: 195 lb (88.5 kg)    Height: 5\' 9"  (1.753 m)    PainSc: 5   0-No pain    Physical Exam: Physical Exam Musculoskeletal:     Left hand: He exhibits laceration. Normal sensation noted. Normal strength noted.     Comments: Full ROM to thumb and hand. No signs of deeper tissue injury with laceration  Skin:    Findings: Laceration present.     Comments: Superficial skin avulsion to distal portion of left thumb. Not suitable for repair.     Urgent Care Treatments / Results:   LABS: PLEASE NOTE: all labs that were ordered this encounter are listed, however only abnormal results are displayed. Labs Reviewed - No data to display  EKG: -None  RADIOLOGY: No results found.  PROCEDURES: Wound Care  Date/Time: 03/17/2019 10:02 AM Performed by: Bailey MechBenjamin, Evangelyne Loja, NP Authorized by: Bailey MechBenjamin, Toy Samarin, NP   Consent:    Consent obtained:  Verbal   Consent given by:  Patient   Risks discussed:  Bleeding Procedure details:    Indications: open wounds     Wound age (days):  <1   Debridement level: skin, partial thickness and subcutaneous tissue   Dressing:    Dressing applied:  Occlusive   Wrapped with:  Elastic bandage 2 inch Comments:     Cleansed with surgical scrub; polysporin applied to area.    MEDICATIONS RECEIVED THIS VISIT: Medications  tetanus & diphtheria toxoids (adult) (TENIVAC) injection 0.5 mL (0.5 mLs Intramuscular Given 03/17/19 0937)    PERTINENT CLINICAL COURSE NOTES/UPDATES:   Initial Impression / Assessment and Plan / Urgent Care Course:  Pertinent labs & imaging results that were available during my care of the patient were personally reviewed by me and considered in my medical decision making (see lab/imaging section of note for values and interpretations).  Alexander Hart is a 73 y.o. male who [facility] with complaints of left thumb laceration and diagnosed with a skin avulsion to  left thumb. Wound care as follows: Use over-the-counter triple antibiotic (Neosporin, etc.) to wound until the area begins to scab over. Keep covered as well.   Once area begins to scab, you don't have to use any ointments or treatments. Keep wound uncovered and dry during normal activities, but cover area if there's a chance it may get wet or dirty.   Patient is well appearing overall in clinic today. He does not appear to be in any acute distress. Presenting symptoms (see HPI) and exam as documented above.   I have reviewed the follow up and strict return precautions for any new or worsening symptoms. Patient is aware of symptoms that would be deemed urgent/emergent, and would thus  require further evaluation either here or in the emergency department. At the time of discharge, he verbalized understanding and consent with the discharge plan as it was reviewed with him. All questions were fielded by provider and/or clinic staff prior to patient discharge.    Final Clinical Impressions / Urgent Care Diagnoses:   Final diagnoses:  Avulsion of skin of left thumb without complication, initial encounter    New Prescriptions:  [place] Controlled Substance Registry consulted? Not Applicable  Meds ordered this encounter  Medications   tetanus & diphtheria toxoids (adult) (TENIVAC) injection 0.5 mL    Recommended Follow up Care:  Patient encouraged to follow up with the following provider within the specified time frame, or sooner as dictated by the severity of his symptoms. As always, he was instructed that for any urgent/emergent care needs, he should seek care either here or in the emergency department for more immediate evaluation.   Bailey MechLunise Betty Daidone, DNP, NP-c    Bailey MechBenjamin, Patryce Depriest, NP 03/17/19 1006

## 2019-10-25 ENCOUNTER — Other Ambulatory Visit: Payer: Self-pay

## 2019-10-25 ENCOUNTER — Encounter: Payer: Medicare Other | Attending: Surgery | Admitting: Dietician

## 2019-10-25 ENCOUNTER — Encounter: Payer: Self-pay | Admitting: Dietician

## 2019-10-25 VITALS — Ht 69.0 in | Wt 208.2 lb

## 2019-10-25 DIAGNOSIS — E119 Type 2 diabetes mellitus without complications: Secondary | ICD-10-CM | POA: Diagnosis not present

## 2019-10-25 DIAGNOSIS — E639 Nutritional deficiency, unspecified: Secondary | ICD-10-CM | POA: Insufficient documentation

## 2019-10-25 DIAGNOSIS — Z713 Dietary counseling and surveillance: Secondary | ICD-10-CM | POA: Diagnosis present

## 2019-10-25 NOTE — Patient Instructions (Signed)
   Start with smaller portions of foods with meals. Limit starchy foods to 1 cup (fist size) or less; keep meats to 3-4oz (palm-size) on average.   Use smaller amounts of mayonnaise and butter on foods.   Try tricks to eat slower and avoid urge to get second helpings: put fork down between bites of food, chew food more before swallowing, maybe use a smaller for eating.

## 2019-10-25 NOTE — Progress Notes (Signed)
Medical Nutrition Therapy: Visit start time: 1530  end time: 1630  Assessment:  Diagnosis: Type 2 Diabetes Past medical history: sleep apnea Psychosocial issues/ stress concerns: none  Preferred learning method:  . Auditory . Hands-on   Current weight: 208.2lbs with shoes and jacket Height: 5'9" Medications, supplements: reconciled list in medical record  Progress and evaluation:   Patient reports some diabetes nutrition eductaion about 5 years ago. BG and weight have since increased. He would like to lose to goal of 175lbs, and wants to improve BG control.  Reports fasting BGs ranging widely, 130s - 200+, depending on intake and activity. Recdent HbA1C of 8.3%.  He feels his food portions are too large, and he often will eat second portions. He has been working to reduce sugar intake.  Wife is RN and patient states she generally prepares balanced meals with healthy choices.   Physical activity: on-the-job walking   Dietary Intake:  Usual eating pattern includes 3 meals and 2-3 snacks per day. Dining out frequency: 2-3 meals per week.  Breakfast: eggs and bacon occ with bread; grits and eggs; sausage; cheerios; pancakes with sf syrup and butter no meat; BLT Snack: mixed nuts; banana; protein bar; not many sweets Lunch: recently stopped eating pasta (was eating frequently); sandwich ie grilled cheese; at work 3x a week; wife prepares lunch other days and serves balanced meals; patient states he sometimes "cheats" by eating fast foods ie fried chicken sandwich or burger, mostly overeats.  Snack: none or same as am Supper: wife usually cooks healthy meal -- veg, lean meat often grilled chicken, brown or wild rice; sometimes casserole with veg and no meat; often eats second helpings at dinner meals. Snack: none or same as am or popcorn Beverages: sugar free drinks -- coffee with splenda, low sugar creamer; diet coke; water with sugar free flavoring  Nutrition Care Education: Topics  covered:  Basic nutrition: basic food groups, appropriate nutrient balance, appropriate meal and snack schedule, general nutrition guidelines    Weight control: importance of low sugar and low fat choices, portion control strategies including eating slowly, using smaller plates; estimated energy needs for weight loss at about 1600-1700 kcal, provided guidance for 45%CHO, 25% pro, 30% fat Diabetes: appropriate meal and snack schedule, appropriate carb intake and balance, healthy carb choices, role of fiber, protein   Nutritional Diagnosis:  [place]-2.2 Altered nutrition-related laboratory As related to Type 2 diabetes.  As evidenced by patient with recent HbA1C of 8.3%. [place]-3.3 Overweight/obesity As related to excess calories.  As evidenced by patient with current BMI of 30.75.  Intervention:   Instruction and discussion as noted above.  Patient voices readiness to work on diet changes and has support from spouse.  Established nutrition goals with direction from patient.  No follow-up scheduled at this time; patient will schedule later if needed.  Education Materials given:  . Plate Planner with food lists, sample meal pattern . Goals/ instructions   Learner/ who was taught:  . Patient   Level of understanding: Marland Kitchen Verbalizes/ demonstrates competency   Demonstrated degree of understanding via:   Teach back Learning barriers: . None   Willingness to learn/ readiness for change: . Eager, change in progress  Monitoring and Evaluation:  Dietary intake, exercise, BG control, and body weight      follow up: prn

## 2019-11-22 IMAGING — US US ABDOMEN LIMITED
1 series · 14 of 25 positions shown · non-contrast
Comparison: None.

CLINICAL DATA: Chest pain.

EXAM:
ULTRASOUND ABDOMEN LIMITED RIGHT UPPER QUADRANT

[Series 1: us abdomen limited · 14 of 57 slices shown]
[im 1/57]
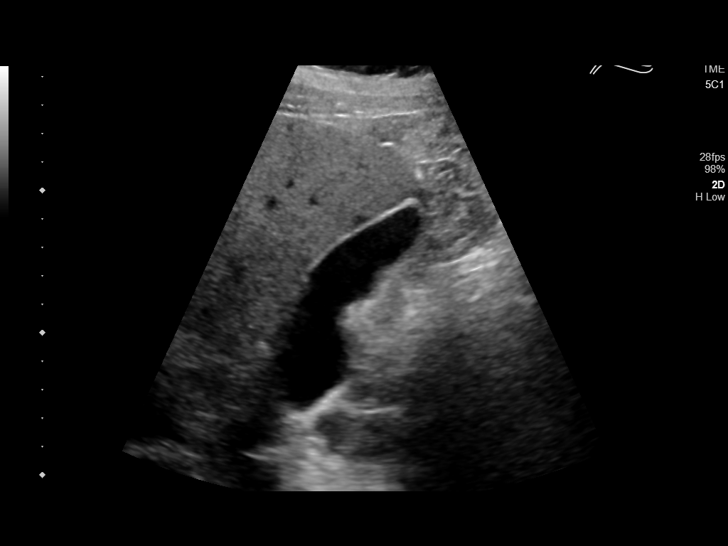
[im 5/57]
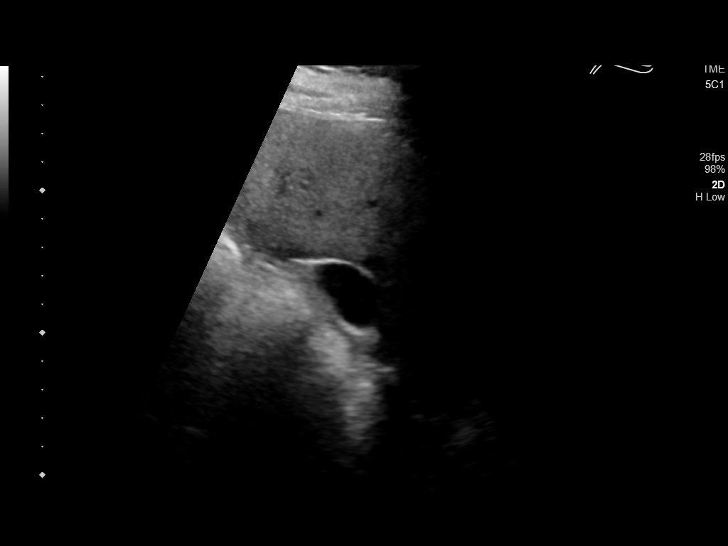
[im 10/57]
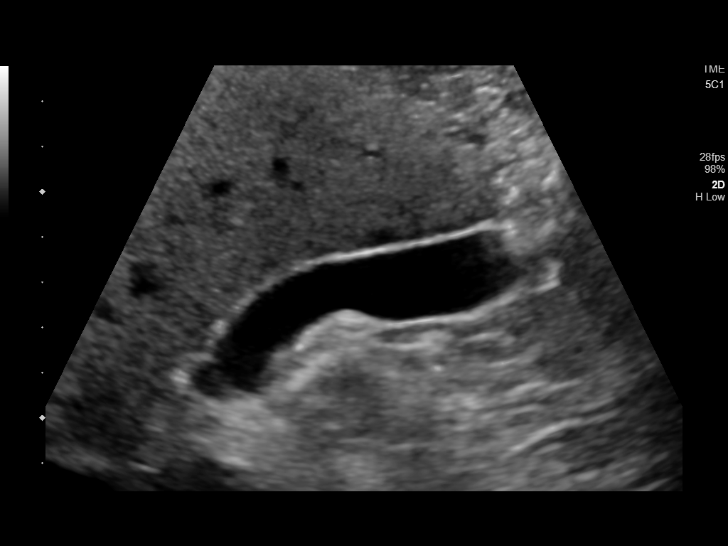
[im 15/57]
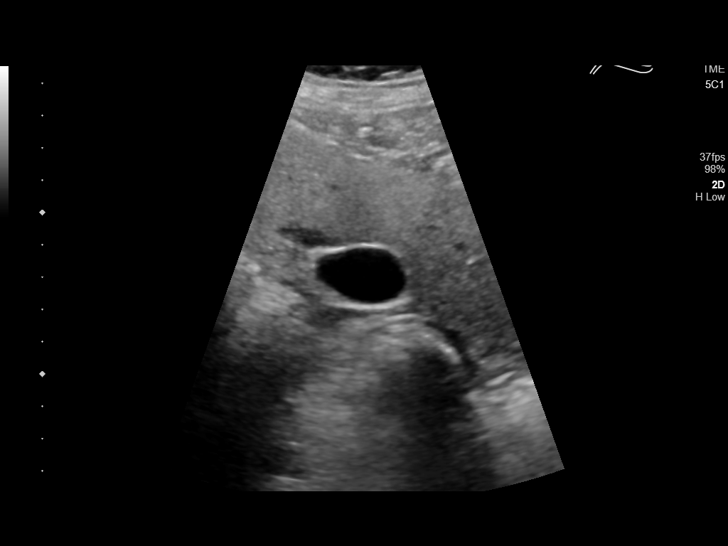
[im 19/57]
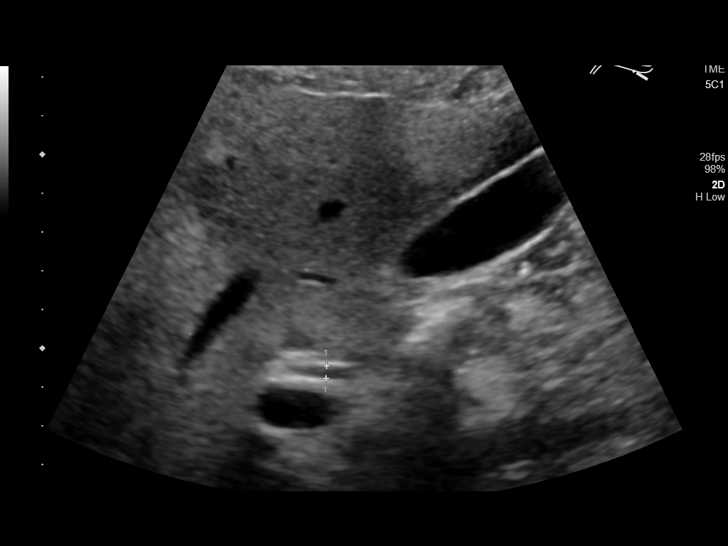
[im 22/57]
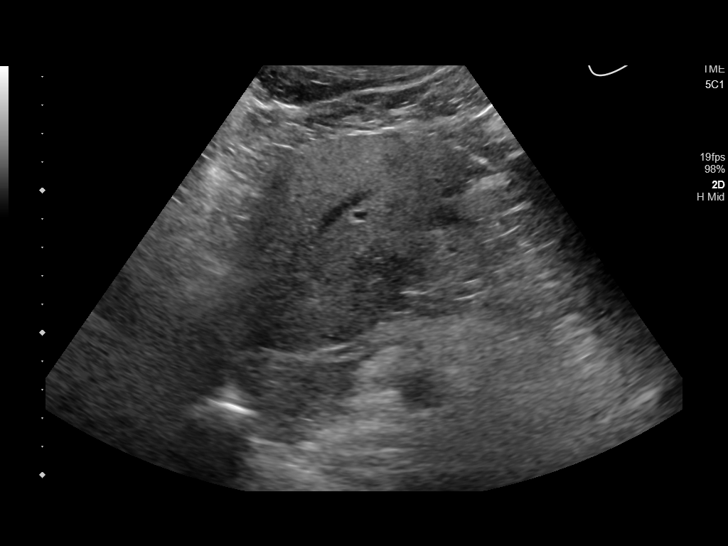
[im 26/57]
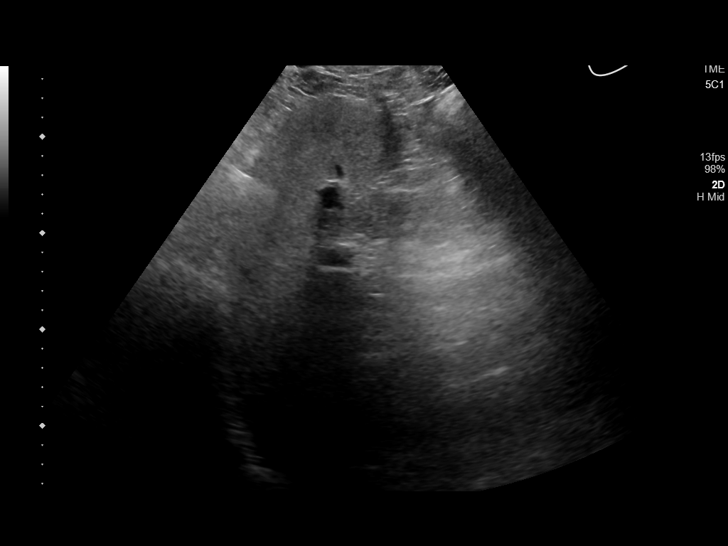
[im 31/57]
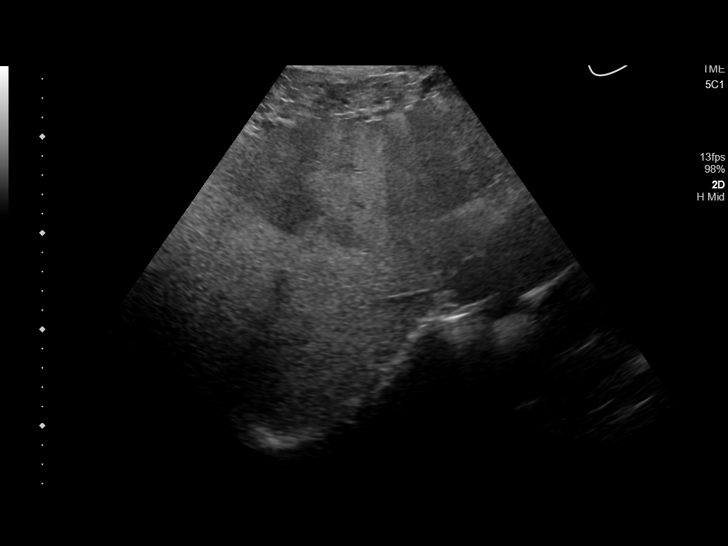
[im 36/57]
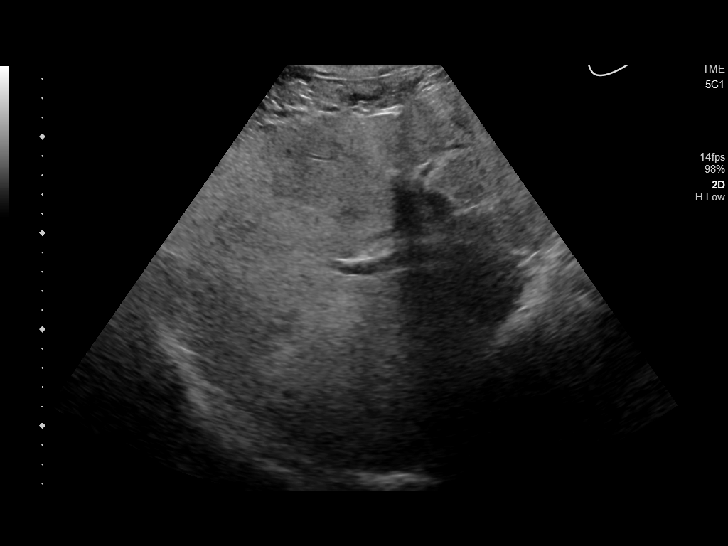
[im 38/57]
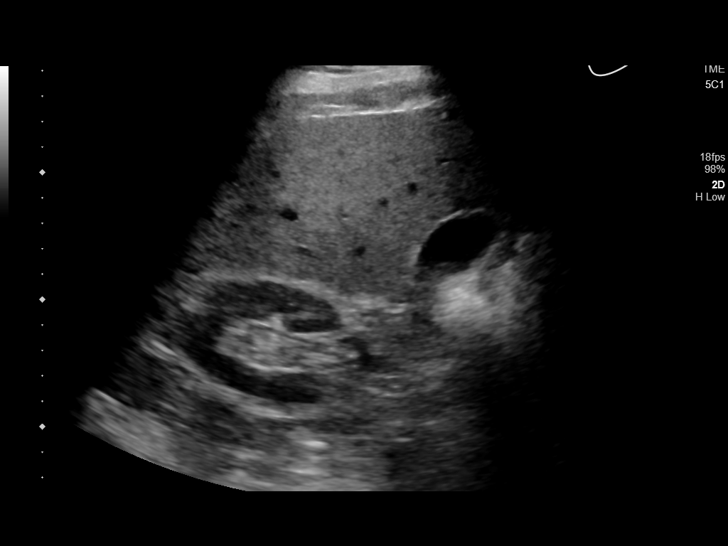
[im 43/57]
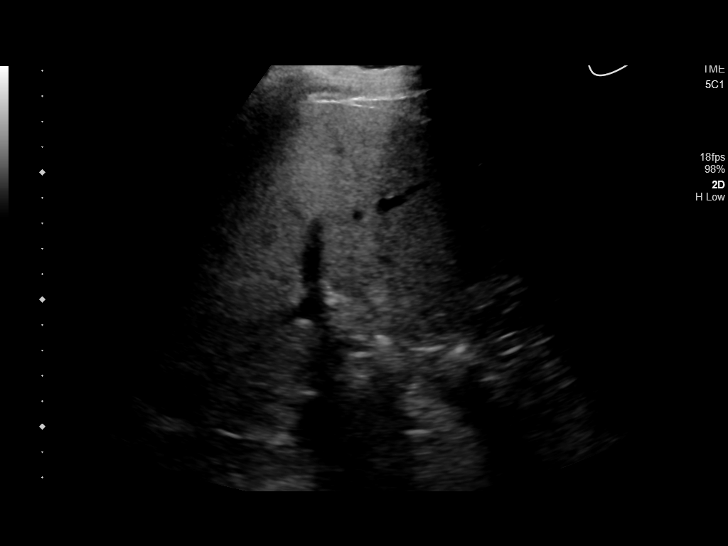
[im 47/57]
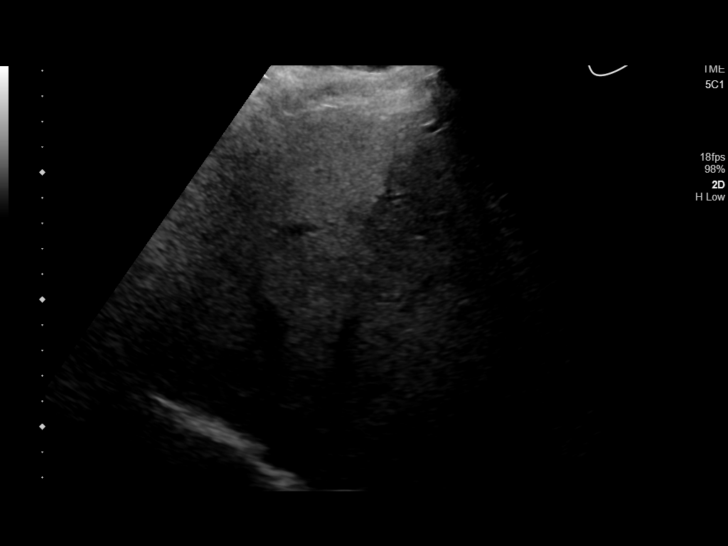
[im 52/57]
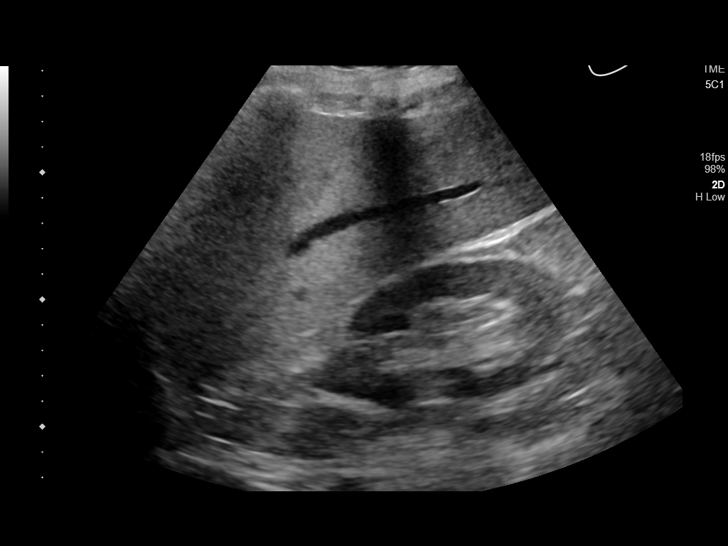
[im 57/57]
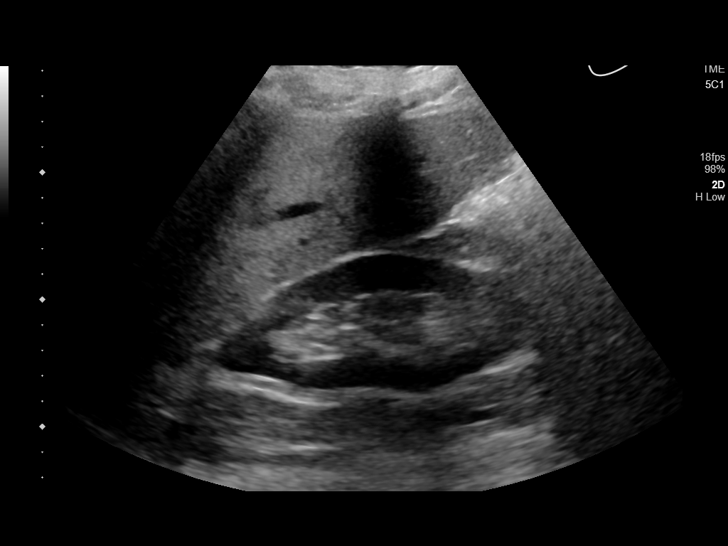

[14 of 25 positions shown; findings below may reference images not displayed]

FINDINGS: Gallbladder:

No gallstones or wall thickening visualized. No sonographic Murphy
sign noted by sonographer.

Common bile duct:

Diameter: 3.3 mm, normal

Liver:

Heterogeneous increased parenchymal echotexture consistent with
diffuse fatty infiltration with areas of fat sparing. No focal liver
lesions otherwise identified. Portal vein is patent on color Doppler
imaging with normal direction of blood flow towards the liver.
IMPRESSION: No evidence of cholelithiasis or cholecystitis. Diffuse fatty
infiltration of the liver.

## 2019-11-22 IMAGING — CR DG CHEST 2V
2 series · 2 of 2 positions shown · non-contrast
Comparison: 01/19/2016

CLINICAL DATA: Left-sided chest pain radiating to left arm for 2
hours. Nausea.

EXAM:
CHEST - 2 VIEW

[chest pa]
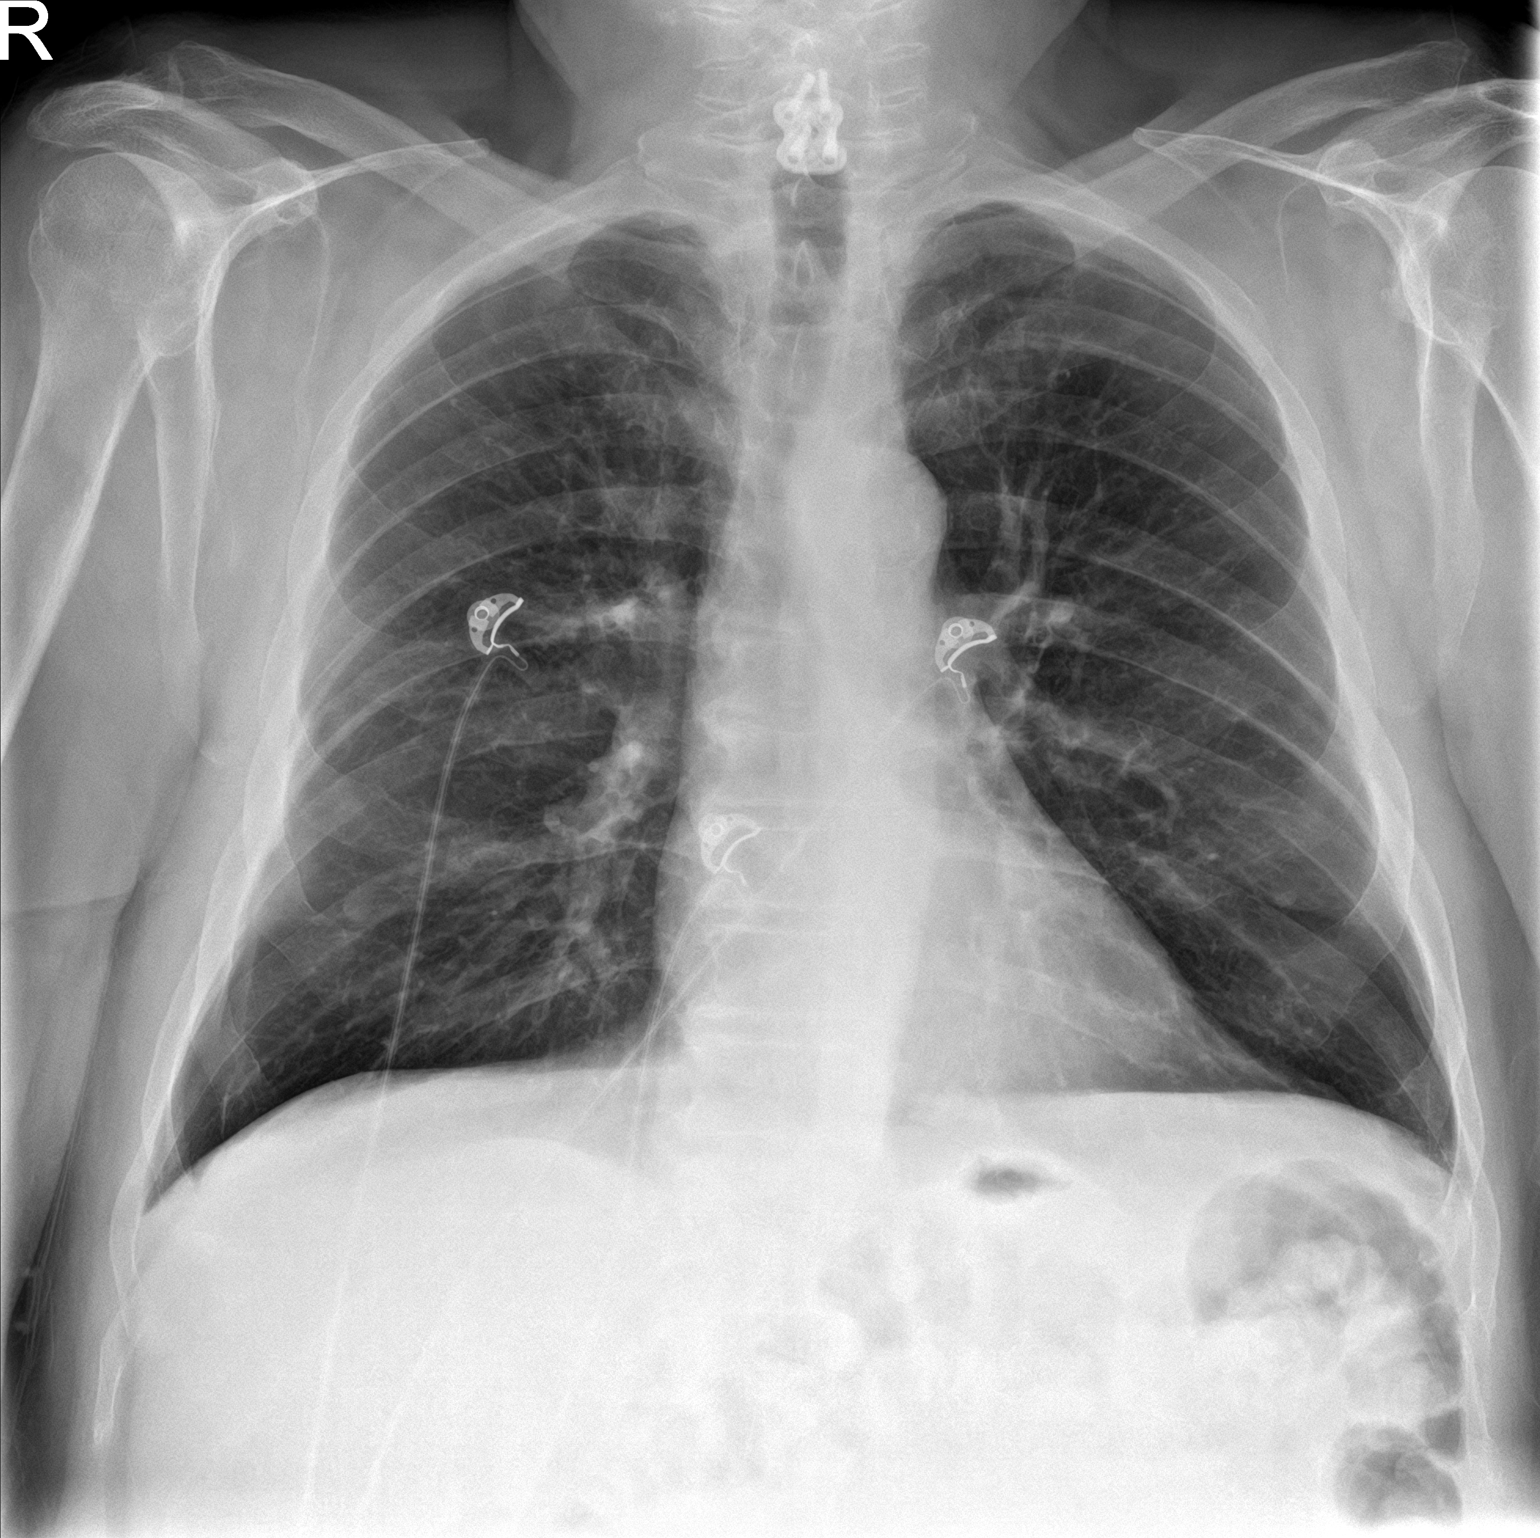

[chest lat]
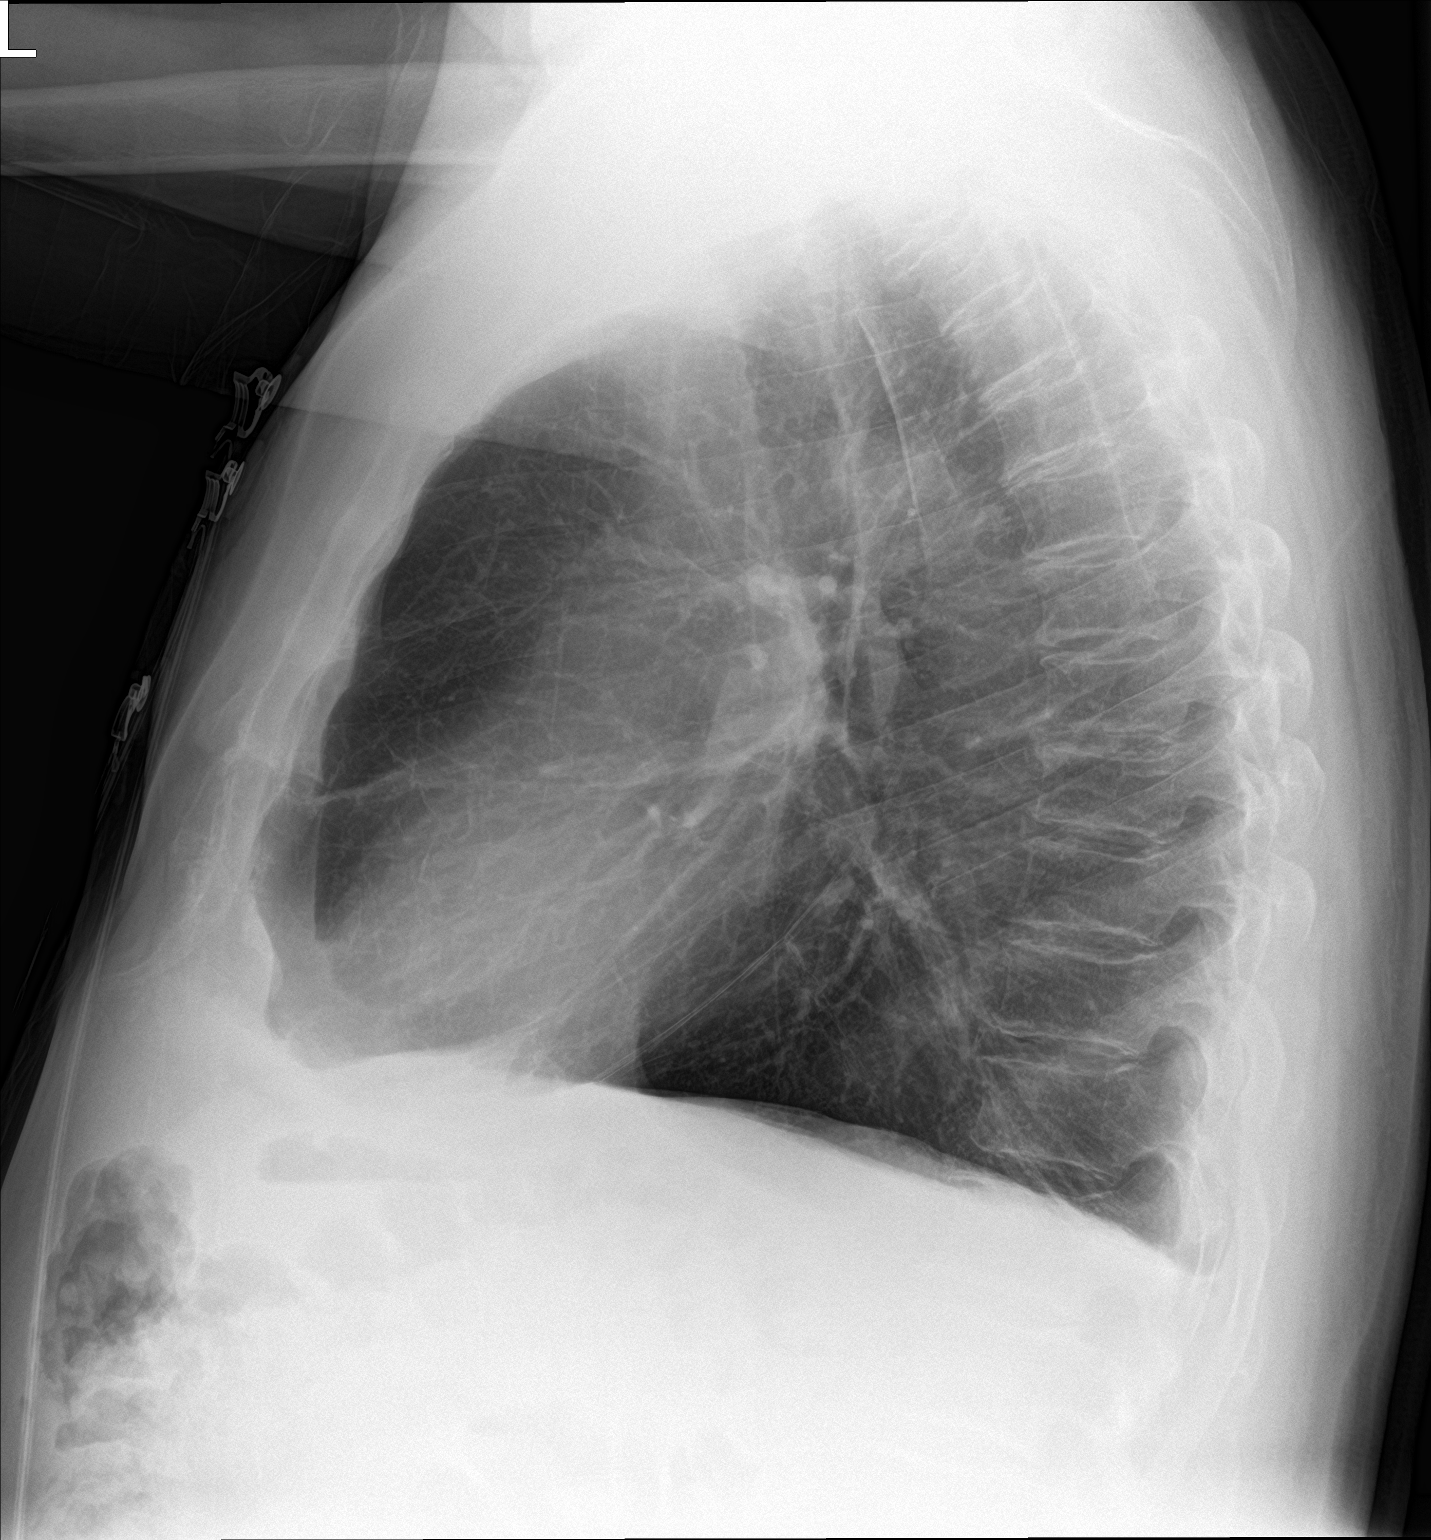

[2 of 2 positions shown; findings below may reference images not displayed]

FINDINGS: The heart size and mediastinal contours are within normal limits.
Both lungs are clear. Cervical spine fusion hardware again noted.
IMPRESSION: No active cardiopulmonary disease.

## 2021-07-09 ENCOUNTER — Ambulatory Visit
Admission: RE | Admit: 2021-07-09 | Discharge: 2021-07-09 | Disposition: A | Discharge status: Home / Self Care | Payer: Medicare Other | Source: Ambulatory Visit | Attending: Emergency Medicine | Admitting: Emergency Medicine

## 2021-07-09 ENCOUNTER — Other Ambulatory Visit: Payer: Self-pay

## 2021-07-09 VITALS — BP 121/87 | HR 98 | Temp 98.5°F | Resp 16 | Ht 69.0 in | Wt 200.0 lb

## 2021-07-09 DIAGNOSIS — J069 Acute upper respiratory infection, unspecified: Secondary | ICD-10-CM | POA: Insufficient documentation

## 2021-07-09 DIAGNOSIS — Z20822 Contact with and (suspected) exposure to covid-19: Secondary | ICD-10-CM | POA: Insufficient documentation

## 2021-07-09 DIAGNOSIS — R059 Cough, unspecified: Secondary | ICD-10-CM | POA: Insufficient documentation

## 2021-07-09 LAB — RESP PANEL BY RT-PCR (FLU A&B, COVID) ARPGX2
Influenza A by PCR: NEGATIVE
Influenza B by PCR: NEGATIVE
SARS Coronavirus 2 by RT PCR: NEGATIVE

## 2021-07-09 NOTE — ED Triage Notes (Signed)
Pt c/o cough, congestion, x 2 days.

## 2021-07-09 NOTE — Discharge Instructions (Signed)
We are testing for COVID and flu, please quarantine until results known. No antibiotics indicated at present. Rest,push fluids, may use OTC Coricidin HBP as label directed for symptom management.

## 2021-07-09 NOTE — ED Provider Notes (Signed)
[facility]    CSN: [id number] Arrival date & time: 07/09/21  0805      History   Chief Complaint Chief Complaint  Patient presents with   Cough   Nasal Congestion    Chest congestion      HPI Alexander Hart is a 75 y.o. male.   75 year old male pt, Alexander Hart, presents to UC with chief complaint of cough congestion since Tuesday pm(1.5 days). Pt has been taking OTC meds, BS check 150 range,checks ever am on oral meds only. No known illness exposure. Pt is covid vaccinated boosted and has had flu shot. Pt is afebrile in office, vital signs stable, wife is a Engineer, civil (consulting) and wanted me to get checked out.  The history is provided by the patient. No language interpreter was used.   Past Medical History:  Diagnosis Date   Anxiety    CONTROLLED ON PROZAC   Diabetes mellitus type II, non insulin dependent (HCC)    Epicondylitis syndrome of elbow    LEFT, ARTHRITIS ANKLES,SHOULDERS,KNEES   Essential hypertension    Hyperlipidemia    Hyperlipidemia associated with type 2 diabetes mellitus (HCC)    Motion sickness    CAR   Restless leg syndrome    Sleep apnea    S/P T&A AND SINUS SURGERY, USES CPAP    Patient Active Problem List   Diagnosis Date Noted   Viral URI with cough 07/09/2021   Chest pain with moderate risk for cardiac etiology 05/07/2018   Hyperlipidemia associated with type 2 diabetes mellitus (HCC)    Diabetes mellitus type II, non insulin dependent (HCC)    Essential hypertension     Past Surgical History:  Procedure Laterality Date   ANTERIOR FUSION CERVICAL SPINE     C5-6   CATARACT EXTRACTION W/PHACO Right 05/09/2017   Procedure: CATARACT EXTRACTION PHACO AND INTRAOCULAR LENS PLACEMENT (IOC) RIGHT DIABETIC;  Surgeon: Nevada Crane, MD;  Location: [facility];  Service: Ophthalmology;  Laterality: Right;  Diabetic - oral meds sleep apnea   CATARACT EXTRACTION W/PHACO Left 06/21/2017   Procedure: CATARACT EXTRACTION PHACO AND  INTRAOCULAR LENS PLACEMENT (IOC)  LEFT DIABETIC;  Surgeon: Nevada Crane, MD;  Location: [facility];  Service: Ophthalmology;  Laterality: Left;  Diabetic - oral meds sleep apnea   FOOT FRACTURE SURGERY Left    NASAL TURBINATE REDUCTION     TENNIS ELBOW RELEASE/NIRSCHEL PROCEDURE Left 05/23/2015   Procedure: TENNIS ELBOW RELEASE/NIRSCHEL PROCEDURE;  Surgeon: Erin Sons, MD;  Location: [facility];  Service: Orthopedics;  Laterality: Left;  CPAP AND DIABETIC   TONSILLECTOMY     TRIGGER FINGER RELEASE Bilateral 2005       Home Medications    Prior to Admission medications   Medication Sig Start Date End Date Taking? Authorizing Provider  aspirin 81 MG tablet Take 81 mg by mouth daily. AM    [provider]  atorvastatin (LIPITOR) 20 MG tablet Take 20 mg by mouth daily. AM    [provider]  FLUoxetine (PROZAC) 40 MG capsule Take 40 mg by mouth daily. AM    [provider]  linagliptin (TRADJENTA) 5 MG TABS tablet Take 5 mg by mouth daily.    [provider]  lisinopril (ZESTRIL) 10 MG tablet Take 10 mg by mouth daily. 08/24/19   [provider]  metFORMIN (GLUCOPHAGE) 1000 MG tablet Take 1,000 mg by mouth 2 (two) times daily. 07/09/19   [provider]  Letta Pate  ULTRA test strip USE 3 (THREE) TIMES DAILY 10/08/19   [provider]  pioglitazone (ACTOS) 30 MG tablet Take 30 mg by mouth daily. AM    [provider]  pramipexole (MIRAPEX) 1 MG tablet Take 2 mg by mouth daily. PM    [provider]    Family History History reviewed. No pertinent family history.  Social History Social History   Tobacco Use   Smoking status: Never   Smokeless tobacco: Never  Vaping Use   Vaping Use: Never used  Substance Use Topics   Alcohol use: Yes    Comment: 1/ MONTH WINE   Drug use: No     Allergies   Patient has no known allergies.   Review of Systems Review of Systems  HENT:   Positive for congestion.   Respiratory:  Positive for cough. Negative for wheezing.   All other systems reviewed and are negative.   Physical Exam Triage Vital Signs ED Triage Vitals  Enc Vitals Group     BP      Pulse      Resp      Temp      Temp src      SpO2      Weight      Height      Head Circumference      Peak Flow      Pain Score      Pain Loc      Pain Edu?      Excl. in GC?    No data found.  Updated Vital Signs BP 121/87 (BP Location: Left Arm)    Pulse 98    Temp 98.5 F (36.9 C) (Oral)    Resp 16    Ht 5\' 9"  (1.753 m)    Wt 200 lb (90.7 kg)    SpO2 97%    BMI 29.53 kg/m   Visual Acuity Right Eye Distance:   Left Eye Distance:   Bilateral Distance:    Right Eye Near:   Left Eye Near:    Bilateral Near:     Physical Exam Vitals and nursing note reviewed.  Constitutional:      General: He is not in acute distress.    Appearance: He is well-developed and well-groomed. He is not ill-appearing or toxic-appearing.  HENT:     Head: Normocephalic.     Right Ear: Tympanic membrane is retracted.     Left Ear: Tympanic membrane is retracted.     Nose: Mucosal edema present.     Mouth/Throat:     Pharynx: Uvula midline.  Eyes:     General: Lids are normal.     Conjunctiva/sclera: Conjunctivae normal.     Pupils: Pupils are equal, round, and reactive to light.  Cardiovascular:     Rate and Rhythm: Normal rate and regular rhythm.     Pulses: Normal pulses.     Heart sounds: Normal heart sounds.  Pulmonary:     Effort: Pulmonary effort is normal. No respiratory distress.     Breath sounds: Normal breath sounds and air entry. No decreased breath sounds or wheezing.  Abdominal:     General: There is no distension.     Palpations: Abdomen is soft.  Musculoskeletal:        General: Normal range of motion.     Cervical back: Normal range of motion.  Skin:    General: Skin is warm and dry.     Findings: No rash.  Neurological:  General: No focal  deficit present.     Mental Status: He is alert and oriented to person, place, and time.     GCS: GCS eye subscore is 4. GCS verbal subscore is 5. GCS motor subscore is 6.     Cranial Nerves: No cranial nerve deficit.     Sensory: No sensory deficit.  Psychiatric:        Attention and Perception: Attention normal.        Mood and Affect: Mood normal.        Speech: Speech normal.        Behavior: Behavior normal. Behavior is cooperative.     UC Treatments / Results  Labs (all labs ordered are listed, but only abnormal results are displayed) Labs Reviewed  RESP PANEL BY RT-PCR (FLU A&B, COVID) [id number]    EKG   Radiology No results found.  Procedures Procedures (including critical care time)  Medications Ordered in UC Medications - No data to display  Initial Impression / Assessment and Plan / UC Course  I have reviewed the triage vital signs and the nursing notes.  Pertinent labs & imaging results that were available during my care of the patient were reviewed by me and considered in my medical decision making (see chart for details).     Ddx: Viral illness, allergies Final Clinical Impressions(s) / UC Diagnoses   Final diagnoses:  Viral URI with cough     Discharge Instructions      We are testing for COVID and flu, please quarantine until results known. No antibiotics indicated at present. Rest,push fluids, may use OTC Coricidin HBP as label directed for symptom management.      ED Prescriptions   None    PDMP not reviewed this encounter.   Clancy Gourd, NP 07/09/21 1326

## 2021-07-11 ENCOUNTER — Ambulatory Visit
Admission: RE | Admit: 2021-07-11 | Discharge: 2021-07-11 | Disposition: A | Discharge status: Home / Self Care | Payer: Medicare Other | Source: Ambulatory Visit | Attending: Emergency Medicine | Admitting: Emergency Medicine

## 2021-07-11 ENCOUNTER — Other Ambulatory Visit: Payer: Self-pay

## 2021-07-11 VITALS — BP 122/73 | HR 102 | Temp 99.8°F | Resp 16 | Ht 69.0 in | Wt 200.0 lb

## 2021-07-11 DIAGNOSIS — J069 Acute upper respiratory infection, unspecified: Secondary | ICD-10-CM | POA: Diagnosis not present

## 2021-07-11 MED ORDER — IPRATROPIUM BROMIDE 0.06 % NA SOLN: 2.0000 | Freq: Four times a day (QID) | NASAL | 12 refills | Status: DC

## 2021-07-11 MED ORDER — PROMETHAZINE-DM 6.25-15 MG/5ML PO SYRP: 5.0000 mL | ORAL_SOLUTION | Freq: Four times a day (QID) | ORAL | 0 refills | Status: DC | PRN

## 2021-07-11 MED ORDER — BENZONATATE 100 MG PO CAPS: 200.0000 mg | ORAL_CAPSULE | Freq: Three times a day (TID) | ORAL | 0 refills | Status: DC

## 2021-07-11 NOTE — ED Triage Notes (Signed)
Pt here with C/O cough congestion for 5 days, fever (100.6) for 2 days. Was seen on 07/09/2021 was  not given any medications. Coughing is worst.

## 2021-07-11 NOTE — Discharge Instructions (Signed)

## 2021-07-11 NOTE — ED Provider Notes (Signed)
[facility]    CSN: [id number] Arrival date & time: 07/11/21  1439      History   Chief Complaint No chief complaint on file.   HPI Alexander Hart is a 75 y.o. male.   HPI  75 year old male here for reevaluation of respiratory complaints.  Patient was seen in this urgent care 2 days ago for upper respiratory complaints that consist of runny nose and nasal congestion and cough that been present for day and a half before presentation.  He had a negative respiratory triplex panel for COVID and influenza and was discharged home with a diagnosis of viral URI.  He is return because he now is running a fever with a T-max of 101, continues to have nasal congestion, has a sore throat, headache, shortness breath or wheezing, and a productive cough.  He states that he does not spit the sputum out she does know it looks like.  He denies runny nose, ear pain, GI complaints, or body aches.  Past Medical History:  Diagnosis Date   Anxiety    CONTROLLED ON PROZAC   Diabetes mellitus type II, non insulin dependent (HCC)    Epicondylitis syndrome of elbow    LEFT, ARTHRITIS ANKLES,SHOULDERS,KNEES   Essential hypertension    Hyperlipidemia    Hyperlipidemia associated with type 2 diabetes mellitus (HCC)    Motion sickness    CAR   Restless leg syndrome    Sleep apnea    S/P T&A AND SINUS SURGERY, USES CPAP    Patient Active Problem List   Diagnosis Date Noted   Viral URI with cough 07/09/2021   Chest pain with moderate risk for cardiac etiology 05/07/2018   Hyperlipidemia associated with type 2 diabetes mellitus (HCC)    Diabetes mellitus type II, non insulin dependent (HCC)    Essential hypertension     Past Surgical History:  Procedure Laterality Date   ANTERIOR FUSION CERVICAL SPINE     C5-6   CATARACT EXTRACTION W/PHACO Right 05/09/2017   Procedure: CATARACT EXTRACTION PHACO AND INTRAOCULAR LENS PLACEMENT (IOC) RIGHT DIABETIC;  Surgeon: Nevada Crane, MD;   Location: [facility];  Service: Ophthalmology;  Laterality: Right;  Diabetic - oral meds sleep apnea   CATARACT EXTRACTION W/PHACO Left 06/21/2017   Procedure: CATARACT EXTRACTION PHACO AND INTRAOCULAR LENS PLACEMENT (IOC)  LEFT DIABETIC;  Surgeon: Nevada Crane, MD;  Location: [facility];  Service: Ophthalmology;  Laterality: Left;  Diabetic - oral meds sleep apnea   FOOT FRACTURE SURGERY Left    NASAL TURBINATE REDUCTION     TENNIS ELBOW RELEASE/NIRSCHEL PROCEDURE Left 05/23/2015   Procedure: TENNIS ELBOW RELEASE/NIRSCHEL PROCEDURE;  Surgeon: Erin Sons, MD;  Location: [facility];  Service: Orthopedics;  Laterality: Left;  CPAP AND DIABETIC   TONSILLECTOMY     TRIGGER FINGER RELEASE Bilateral 2005       Home Medications    Prior to Admission medications   Medication Sig Start Date End Date Taking? Authorizing Provider  aspirin 81 MG tablet Take 81 mg by mouth daily. AM   Yes [provider]  atorvastatin (LIPITOR) 20 MG tablet Take 20 mg by mouth daily. AM   Yes [provider]  benzonatate (TESSALON) 100 MG capsule Take 2 capsules (200 mg total) by mouth every 8 (eight) hours. 07/11/21  Yes Becky Augusta, NP  FLUoxetine (PROZAC) 40 MG capsule Take 40 mg by mouth daily. AM   Yes [provider]  ipratropium (ATROVENT) 0.06 %  nasal spray Place 2 sprays into both nostrils 4 (four) times daily. 07/11/21  Yes Becky Augusta, NP  linagliptin (TRADJENTA) 5 MG TABS tablet Take 5 mg by mouth daily.   Yes [provider]  lisinopril (ZESTRIL) 10 MG tablet Take 10 mg by mouth daily. 08/24/19  Yes [provider]  metFORMIN (GLUCOPHAGE) 1000 MG tablet Take 1,000 mg by mouth 2 (two) times daily. 07/09/19  Yes [provider]  ONETOUCH ULTRA test strip USE 3 (THREE) TIMES DAILY 10/08/19  Yes [provider]  pioglitazone (ACTOS) 30 MG tablet Take 30 mg by mouth daily. AM   Yes [provider]   pramipexole (MIRAPEX) 1 MG tablet Take 2 mg by mouth daily. PM   Yes [provider]  promethazine-dextromethorphan (PROMETHAZINE-DM) 6.25-15 MG/5ML syrup Take 5 mLs by mouth 4 (four) times daily as needed. 07/11/21  Yes Becky Augusta, NP    Family History History reviewed. No pertinent family history.  Social History Social History   Tobacco Use   Smoking status: Never   Smokeless tobacco: Never  Vaping Use   Vaping Use: Never used  Substance Use Topics   Alcohol use: Yes    Comment: 1/ MONTH WINE   Drug use: No     Allergies   Patient has no known allergies.   Review of Systems Review of Systems  Constitutional:  Positive for fever. Negative for activity change and appetite change.  HENT:  Positive for congestion and sore throat. Negative for ear pain and rhinorrhea.   Respiratory:  Positive for cough, shortness of breath and wheezing.   Gastrointestinal:  Negative for diarrhea, nausea and vomiting.  Musculoskeletal:  Negative for arthralgias and myalgias.  Skin:  Negative for rash.  Neurological:  Positive for headaches.  Hematological: Negative.   Psychiatric/Behavioral: Negative.      Physical Exam Triage Vital Signs ED Triage Vitals  Enc Vitals Group     BP 07/11/21 1527 122/73     Pulse Rate 07/11/21 1527 (!) 102     Resp 07/11/21 1527 16     Temp 07/11/21 1527 99.8 F (37.7 C)     Temp Source 07/11/21 1527 Oral     SpO2 07/11/21 1527 95 %     Weight 07/11/21 1526 200 lb (90.7 kg)     Height 07/11/21 1526 5\' 9"  (1.753 m)     Head Circumference --      Peak Flow --      Pain Score 07/11/21 1526 8     Pain Loc --      Pain Edu? --      Excl. in GC? --    No data found.  Updated Vital Signs BP 122/73 (BP Location: Left Arm)    Pulse (!) 102    Temp 99.8 F (37.7 C) (Oral)    Resp 16    Ht 5\' 9"  (1.753 m)    Wt 200 lb (90.7 kg)    SpO2 95%    BMI 29.53 kg/m   Visual Acuity Right Eye Distance:   Left Eye Distance:   Bilateral Distance:     Right Eye Near:   Left Eye Near:    Bilateral Near:     Physical Exam Vitals and nursing note reviewed.  Constitutional:      General: He is not in acute distress.    Appearance: Normal appearance. He is not ill-appearing.  HENT:     Head: Normocephalic and atraumatic.     Right  Ear: Tympanic membrane, ear canal and external ear normal. There is no impacted cerumen.     Left Ear: Tympanic membrane, ear canal and external ear normal. There is no impacted cerumen.     Nose: Congestion and rhinorrhea present.     Mouth/Throat:     Mouth: Mucous membranes are moist.     Pharynx: Oropharynx is clear. Posterior oropharyngeal erythema present.  Cardiovascular:     Rate and Rhythm: Normal rate and regular rhythm.     Pulses: Normal pulses.     Heart sounds: Normal heart sounds. No murmur heard.   No gallop.  Pulmonary:     Effort: Pulmonary effort is normal.     Breath sounds: Normal breath sounds. No wheezing, rhonchi or rales.  Musculoskeletal:     Cervical back: Normal range of motion and neck supple.  Lymphadenopathy:     Cervical: No cervical adenopathy.  Skin:    General: Skin is warm and dry.     Capillary Refill: Capillary refill takes less than 2 seconds.     Findings: No erythema or rash.  Neurological:     General: No focal deficit present.     Mental Status: He is alert and oriented to person, place, and time.  Psychiatric:        Mood and Affect: Mood normal.        Behavior: Behavior normal.        Thought Content: Thought content normal.        Judgment: Judgment normal.     UC Treatments / Results  Labs (all labs ordered are listed, but only abnormal results are displayed) Labs Reviewed - No data to display  EKG   Radiology No results found.  Procedures Procedures (including critical care time)  Medications Ordered in UC Medications - No data to display  Initial Impression / Assessment and Plan / UC Course  I have reviewed the triage vital  signs and the nursing notes.  Pertinent labs & imaging results that were available during my care of the patient were reviewed by me and considered in my medical decision making (see chart for details).  Patient is a nontoxic-appearing 75 year old male here for evaluation of respiratory complaints as outlined in HPI above.  Patient's physical exam reveals pearly gray tympanic membranes bilaterally with normal light reflex and clear external auditory canals.  Nasal mucosa is erythematous and edematous with clear nasal discharge in both nares.  Oropharyngeal exam reveals mild posterior oropharyngeal erythema with clear postnasal drip.  No cervical lymphadenopathy appreciated exam.  Cardiopulmonary exam reveals clear lung sounds in all fields.  I do not feel it necessary to repeat the COVID and flu testing as patient was just tested -2 days ago.  I do believe he has a viral upper respiratory infection and I will give him Atrovent nasal spray to help with the nasal congestion, Tessalon Perles help with cough during the day, and promethazine DM cough syrup help with cough at bedtime.  I did advise the patient that the Promethazine DM will make him drowsy so he should not use it during the day.  I have also advised him that if he continues to have worsening of his symptoms that he should return for reevaluation.   Final Clinical Impressions(s) / UC Diagnoses   Final diagnoses:  Viral URI with cough     Discharge Instructions      Use the Atrovent nasal spray, 2 squirts in each nostril every 6 hours, as needed  for runny nose and postnasal drip.  Use the Tessalon Perles every 8 hours during the day.  Take them with a small sip of water.  They may give you some numbness to the base of your tongue or a metallic taste in your mouth, this is normal.  Use the Promethazine DM cough syrup at bedtime for cough and congestion.  It will make you drowsy so do not take it during the day.  Return for reevaluation  or see your primary care provider for any new or worsening symptoms.      ED Prescriptions     Medication Sig Dispense Auth. Provider   benzonatate (TESSALON) 100 MG capsule Take 2 capsules (200 mg total) by mouth every 8 (eight) hours. 21 capsule Becky Augusta, NP   ipratropium (ATROVENT) 0.06 % nasal spray Place 2 sprays into both nostrils 4 (four) times daily. 15 mL Becky Augusta, NP   promethazine-dextromethorphan (PROMETHAZINE-DM) 6.25-15 MG/5ML syrup Take 5 mLs by mouth 4 (four) times daily as needed. 118 mL Becky Augusta, NP      PDMP not reviewed this encounter.   Becky Augusta, NP 07/11/21 [phone]

## 2021-07-16 ENCOUNTER — Ambulatory Visit
Admission: RE | Admit: 2021-07-16 | Discharge: 2021-07-16 | Disposition: A | Discharge status: Home / Self Care | Payer: Medicare Other | Source: Ambulatory Visit | Attending: Family Medicine | Admitting: Family Medicine

## 2021-07-16 ENCOUNTER — Ambulatory Visit
Admission: RE | Admit: 2021-07-16 | Discharge: 2021-07-16 | Disposition: A | Discharge status: Home / Self Care | Payer: Medicare Other | Attending: Family Medicine | Admitting: Family Medicine

## 2021-07-16 ENCOUNTER — Other Ambulatory Visit: Payer: Self-pay | Admitting: Family Medicine

## 2021-07-16 ENCOUNTER — Other Ambulatory Visit: Payer: Self-pay

## 2021-07-16 DIAGNOSIS — J988 Other specified respiratory disorders: Secondary | ICD-10-CM | POA: Diagnosis not present

## 2021-11-17 ENCOUNTER — Other Ambulatory Visit: Payer: Self-pay | Admitting: Orthopedic Surgery

## 2021-11-17 DIAGNOSIS — M2391 Unspecified internal derangement of right knee: Secondary | ICD-10-CM

## 2021-11-17 DIAGNOSIS — M1711 Unilateral primary osteoarthritis, right knee: Secondary | ICD-10-CM

## 2021-11-17 DIAGNOSIS — G8929 Other chronic pain: Secondary | ICD-10-CM

## 2021-12-02 ENCOUNTER — Ambulatory Visit
Admission: RE | Admit: 2021-12-02 | Discharge: 2021-12-02 | Disposition: A | Discharge status: Home / Self Care | Payer: Medicare Other | Source: Ambulatory Visit | Attending: Orthopedic Surgery | Admitting: Orthopedic Surgery

## 2021-12-02 DIAGNOSIS — G8929 Other chronic pain: Secondary | ICD-10-CM | POA: Insufficient documentation

## 2021-12-02 DIAGNOSIS — M2391 Unspecified internal derangement of right knee: Secondary | ICD-10-CM | POA: Diagnosis present

## 2021-12-02 DIAGNOSIS — M25561 Pain in right knee: Secondary | ICD-10-CM | POA: Diagnosis present

## 2021-12-02 DIAGNOSIS — M1711 Unilateral primary osteoarthritis, right knee: Secondary | ICD-10-CM | POA: Insufficient documentation

## 2022-01-11 ENCOUNTER — Other Ambulatory Visit: Payer: Self-pay

## 2022-01-11 ENCOUNTER — Ambulatory Visit
Admission: EM | Admit: 2022-01-11 | Discharge: 2022-01-11 | Disposition: A | Discharge status: Home / Self Care | Payer: Medicare Other

## 2022-01-11 DIAGNOSIS — S61210A Laceration without foreign body of right index finger without damage to nail, initial encounter: Secondary | ICD-10-CM | POA: Diagnosis not present

## 2022-01-11 NOTE — ED Provider Notes (Signed)
[facility]    CSN: [id number] Arrival date & time: 01/11/22  1348      History   Chief Complaint Chief Complaint  Patient presents with   Extremity Laceration    HPI Alexander Hart is a 76 y.o. male who presents with with R index finger tip cut. He states he was trying to catch a ceiling fan that was falling and cut it with the meal on the motor of it. His TD is up to date.     Past Medical History:  Diagnosis Date   Anxiety    CONTROLLED ON PROZAC   Diabetes mellitus type II, non insulin dependent (HCC)    Epicondylitis syndrome of elbow    LEFT, ARTHRITIS ANKLES,SHOULDERS,KNEES   Essential hypertension    Hyperlipidemia    Hyperlipidemia associated with type 2 diabetes mellitus (HCC)    Motion sickness    CAR   Restless leg syndrome    Sleep apnea    S/P T&A AND SINUS SURGERY, USES CPAP    Patient Active Problem List   Diagnosis Date Noted   Viral URI with cough 07/09/2021   Chest pain with moderate risk for cardiac etiology 05/07/2018   Hyperlipidemia associated with type 2 diabetes mellitus (HCC)    Diabetes mellitus type II, non insulin dependent (HCC)    Essential hypertension     Past Surgical History:  Procedure Laterality Date   ANTERIOR FUSION CERVICAL SPINE     C5-6   CATARACT EXTRACTION W/PHACO Right 05/09/2017   Procedure: CATARACT EXTRACTION PHACO AND INTRAOCULAR LENS PLACEMENT (IOC) RIGHT DIABETIC;  Surgeon: Nevada Crane, MD;  Location: [facility];  Service: Ophthalmology;  Laterality: Right;  Diabetic - oral meds sleep apnea   CATARACT EXTRACTION W/PHACO Left 06/21/2017   Procedure: CATARACT EXTRACTION PHACO AND INTRAOCULAR LENS PLACEMENT (IOC)  LEFT DIABETIC;  Surgeon: Nevada Crane, MD;  Location: [facility];  Service: Ophthalmology;  Laterality: Left;  Diabetic - oral meds sleep apnea   FOOT FRACTURE SURGERY Left    NASAL TURBINATE REDUCTION     TENNIS ELBOW RELEASE/NIRSCHEL PROCEDURE Left  05/23/2015   Procedure: TENNIS ELBOW RELEASE/NIRSCHEL PROCEDURE;  Surgeon: Erin Sons, MD;  Location: [facility];  Service: Orthopedics;  Laterality: Left;  CPAP AND DIABETIC   TONSILLECTOMY     TRIGGER FINGER RELEASE Bilateral 2005       Home Medications    Prior to Admission medications   Medication Sig Start Date End Date Taking? Authorizing Provider  aspirin 81 MG tablet Take 81 mg by mouth daily. AM    [provider]  atorvastatin (LIPITOR) 20 MG tablet Take 20 mg by mouth daily. AM    [provider]  FLUoxetine (PROZAC) 40 MG capsule Take 40 mg by mouth daily. AM    [provider]  lisinopril (ZESTRIL) 10 MG tablet Take 10 mg by mouth daily. 08/24/19   [provider]  metFORMIN (GLUCOPHAGE) 1000 MG tablet Take 1,000 mg by mouth 2 (two) times daily. 07/09/19   [provider]  ONETOUCH ULTRA test strip USE 3 (THREE) TIMES DAILY 10/08/19   [provider]  pioglitazone (ACTOS) 30 MG tablet Take 30 mg by mouth daily. AM    [provider]  pramipexole (MIRAPEX) 1 MG tablet Take 2 mg by mouth daily. PM    [provider]  TRULICITY 0.75 MG/0.5ML SOPN Inject 0.75 mg into the skin once a week. 01/07/22   [provider]  Family History History reviewed. No pertinent family history.  Social History Social History   Tobacco Use   Smoking status: Never   Smokeless tobacco: Never  Vaping Use   Vaping Use: Never used  Substance Use Topics   Alcohol use: Yes    Comment: 1/ MONTH WINE   Drug use: No     Allergies   Patient has no known allergies.   Review of Systems Review of Systems  Skin:  Positive for wound.  Neurological:  Negative for numbness.     Physical Exam Triage Vital Signs ED Triage Vitals  Enc Vitals Group     BP 01/11/22 1403 113/79     Pulse Rate 01/11/22 1403 90     Resp 01/11/22 1403 16     Temp 01/11/22 1403 97.6 F (36.4 C)     Temp Source  01/11/22 1403 Oral     SpO2 01/11/22 1403 99 %     Weight 01/11/22 1358 190 lb (86.2 kg)     Height 01/11/22 1358 5\' 9"  (1.753 m)     Head Circumference --      Peak Flow --      Pain Score 01/11/22 1358 5     Pain Loc --      Pain Edu? --      Excl. in GC? --    No data found.  Updated Vital Signs BP 113/79 (BP Location: Left Arm)   Pulse 90   Temp 97.6 F (36.4 C) (Oral)   Resp 16   Ht 5\' 9"  (1.753 m)   Wt 190 lb (86.2 kg)   SpO2 99%   BMI 28.06 kg/m   Visual Acuity Right Eye Distance:   Left Eye Distance:   Bilateral Distance:    Right Eye Near:   Left Eye Near:    Bilateral Near:     Physical Exam Vitals and nursing note reviewed.  Constitutional:      General: He is not in acute distress.    Appearance: He is not toxic-appearing.  HENT:     Right Ear: External ear normal.     Left Ear: External ear normal.  Eyes:     General: No scleral icterus.    Conjunctiva/sclera: Conjunctivae normal.  Pulmonary:     Effort: Pulmonary effort is normal.  Musculoskeletal:        General: Normal range of motion.     Cervical back: Neck supple.  Skin:    General: Skin is warm and dry.     Comments: R INDEX FINGER- with 1.5 cm semicircle laceration on lateral tip of finger with moderate bleeding controlled with pressure and elevation. ROM of finger is normal.   Neurological:     Mental Status: He is alert and oriented to person, place, and time.     Gait: Gait normal.  Psychiatric:        Mood and Affect: Mood normal.        Behavior: Behavior normal.        Thought Content: Thought content normal.        Judgment: Judgment normal.      UC Treatments / Results  Labs (all labs ordered are listed, but only abnormal results are displayed) Labs Reviewed - No data to display  EKG   Radiology No results found.  Procedures Laceration Repair  Date/Time: 01/11/2022 2:35 PM  Performed by: , PA-C Authorized by: 01/13/2022, PA-C   Consent:    Consent obtained:  Verbal   Consent given by:  Patient   Risks, benefits, and alternatives were discussed: yes     Risks discussed:  Infection and need for additional repair Universal protocol:    Patient identity confirmed:  Verbally with patient Anesthesia:    Anesthesia method:  None Laceration details:    Location:  Finger   Finger location:  R index finger   Length (cm):  1.5   Depth (mm):  1 Pre-procedure details:    Preparation:  Patient was prepped and draped in usual sterile fashion Exploration:    Limited defect created (wound extended): yes     Hemostasis achieved with:  Direct pressure   Imaging outcome: foreign body not noted     Wound exploration: entire depth of wound visualized     Contaminated: no   Treatment:    Area cleansed with:  Shur-Clens and saline   Amount of cleaning:  Standard   Visualized foreign bodies/material removed: no     Debridement:  None Skin repair:    Repair method:  Steri-Strips and tissue adhesive Approximation:    Approximation:  Close Repair type:    Repair type:  Simple Post-procedure details:    Dressing:  Non-adherent dressing   Procedure completion:  Tolerated well, no immediate complications  (including critical care time)  Medications Ordered in UC Medications - No data to display  Initial Impression / Assessment and Plan / UC Course  I have reviewed the triage vital signs and the nursing notes.  Laceration of R index finger  Wound care instructions revied with pt. See instructions.   Final Clinical Impressions(s) / UC Diagnoses   Final diagnoses:  Laceration of right index finger without foreign body without damage to nail, initial encounter     Discharge Instructions      Keep the wound clean and dry with current dressing til Wed( 2 days) and then remove the dressing to look at the wound. Keep it covered when doing things with your hands and wear a glove to keep it dry for 7 days,  then after that you may take showers as usual( do not submerge finger in water for prolonged periods of time) the the glu and strips will fall off Today and tomorrow, keep your arm elevated above your heart.     ED Prescriptions   None    PDMP not reviewed this encounter.   Garey Ham, [place] 01/11/22 1437

## 2022-01-11 NOTE — Discharge Instructions (Signed)
Keep the wound clean and dry with current dressing til Wed( 2 days) and then remove the dressing to look at the wound. Keep it covered when doing things with your hands and wear a glove to keep it dry for 7 days, then after that you may take showers as usual( do not submerge finger in water for prolonged periods of time) the the glu and strips will fall off Today and tomorrow, keep your arm elevated above your heart.

## 2022-01-11 NOTE — ED Triage Notes (Signed)
Pt reports he cut his right index finger on a ceiling fan just PTA. UTD on Tetanus

## 2022-12-09 ENCOUNTER — Encounter: Payer: Self-pay | Admitting: *Deleted

## 2022-12-10 ENCOUNTER — Encounter: Payer: Self-pay | Admitting: *Deleted

## 2022-12-10 ENCOUNTER — Ambulatory Visit
Admission: RE | Admit: 2022-12-10 | Discharge: 2022-12-10 | Disposition: A | Discharge status: Home / Self Care | Payer: Medicare Other | Attending: Gastroenterology | Admitting: Gastroenterology

## 2022-12-10 ENCOUNTER — Encounter
Admission: RE | Disposition: A | Discharge status: Home / Self Care | Payer: Self-pay | Source: Home / Self Care | Attending: Gastroenterology

## 2022-12-10 ENCOUNTER — Other Ambulatory Visit: Payer: Self-pay

## 2022-12-10 ENCOUNTER — Ambulatory Visit: Payer: Medicare Other | Admitting: Anesthesiology

## 2022-12-10 DIAGNOSIS — K5730 Diverticulosis of large intestine without perforation or abscess without bleeding: Secondary | ICD-10-CM | POA: Diagnosis not present

## 2022-12-10 DIAGNOSIS — K640 First degree hemorrhoids: Secondary | ICD-10-CM | POA: Diagnosis not present

## 2022-12-10 DIAGNOSIS — D127 Benign neoplasm of rectosigmoid junction: Secondary | ICD-10-CM | POA: Insufficient documentation

## 2022-12-10 DIAGNOSIS — K7689 Other specified diseases of liver: Secondary | ICD-10-CM | POA: Insufficient documentation

## 2022-12-10 DIAGNOSIS — F419 Anxiety disorder, unspecified: Secondary | ICD-10-CM | POA: Diagnosis not present

## 2022-12-10 DIAGNOSIS — K635 Polyp of colon: Secondary | ICD-10-CM | POA: Insufficient documentation

## 2022-12-10 DIAGNOSIS — E119 Type 2 diabetes mellitus without complications: Secondary | ICD-10-CM | POA: Diagnosis not present

## 2022-12-10 DIAGNOSIS — Z1211 Encounter for screening for malignant neoplasm of colon: Secondary | ICD-10-CM | POA: Diagnosis present

## 2022-12-10 HISTORY — PX: COLONOSCOPY WITH PROPOFOL: SHX5780

## 2022-12-10 HISTORY — DX: Carcinoma in situ, unspecified: D09.9

## 2022-12-10 HISTORY — DX: Fatty (change of) liver, not elsewhere classified: K76.0

## 2022-12-10 LAB — GLUCOSE, CAPILLARY: Glucose-Capillary: 113 mg/dL — ABNORMAL HIGH (ref 70–99)

## 2022-12-10 SURGERY — COLONOSCOPY WITH PROPOFOL
Anesthesia: General

## 2022-12-10 MED ORDER — PROPOFOL 10 MG/ML IV BOLUS
INTRAVENOUS | Status: DC | PRN
  Administered 2022-12-10: 70 mg via INTRAVENOUS

## 2022-12-10 MED ORDER — DEXMEDETOMIDINE HCL IN NACL 80 MCG/20ML IV SOLN
INTRAVENOUS | Status: DC | PRN
  Administered 2022-12-10: 16 ug via INTRAVENOUS

## 2022-12-10 MED ORDER — LIDOCAINE HCL (PF) 2 % IJ SOLN
INTRAMUSCULAR | Status: AC
  Filled 2022-12-10: qty 5

## 2022-12-10 MED ORDER — SODIUM CHLORIDE 0.9 % IV SOLN: INTRAVENOUS | Status: DC

## 2022-12-10 MED ORDER — DEXMEDETOMIDINE HCL IN NACL 80 MCG/20ML IV SOLN
INTRAVENOUS | Status: AC
  Filled 2022-12-10: qty 20

## 2022-12-10 MED ORDER — PROPOFOL 500 MG/50ML IV EMUL
INTRAVENOUS | Status: DC | PRN
  Administered 2022-12-10: 100 ug/kg/min via INTRAVENOUS

## 2022-12-10 MED ORDER — PROPOFOL 10 MG/ML IV BOLUS
INTRAVENOUS | Status: AC
  Filled 2022-12-10: qty 40

## 2022-12-10 MED ORDER — LIDOCAINE HCL (CARDIAC) PF 100 MG/5ML IV SOSY
PREFILLED_SYRINGE | INTRAVENOUS | Status: DC | PRN
  Administered 2022-12-10: 50 mg via INTRAVENOUS

## 2022-12-10 NOTE — Interval H&P Note (Signed)
History and Physical Interval Note:  12/10/2022 10:55 AM  Alexander Hart  has presented today for surgery, with the diagnosis of HX OF ADENOMATOUS POLYP OF COLON.  The various methods of treatment have been discussed with the patient and family. After consideration of risks, benefits and other options for treatment, the patient has consented to  Procedure(s): COLONOSCOPY WITH PROPOFOL (N/A) as a surgical intervention.  The patient's history has been reviewed, patient examined, no change in status, stable for surgery.  I have reviewed the patient's chart and labs.  Questions were answered to the patient's satisfaction.     Regis Bill  Ok to proceed with colonoscopy

## 2022-12-10 NOTE — Anesthesia Preprocedure Evaluation (Signed)
Anesthesia Evaluation  Patient identified by MRN, date of birth, ID band Patient awake    Reviewed: Allergy & Precautions, H&P , NPO status , Patient's Chart, lab work & pertinent test results, reviewed documented beta blocker date and time   History of Anesthesia Complications Negative for: history of anesthetic complications  Airway Mallampati: II  TM Distance: >3 FB Neck ROM: full    Dental  (+) Dental Advidsory Given, Caps, Missing, Poor Dentition   Pulmonary neg shortness of breath, sleep apnea and Continuous Positive Airway Pressure Ventilation , neg COPD, neg recent URI   Pulmonary exam normal breath sounds clear to auscultation       Cardiovascular Exercise Tolerance: Good (-) angina (-) Past MI and (-) Cardiac Stents negative cardio ROS Normal cardiovascular exam(-) dysrhythmias (-) Valvular Problems/Murmurs Rhythm:regular Rate:Normal     Neuro/Psych  PSYCHIATRIC DISORDERS Anxiety     negative neurological ROS     GI/Hepatic Neg liver ROS,GERD  ,,  Endo/Other  diabetes    Renal/GU negative Renal ROS  negative genitourinary   Musculoskeletal   Abdominal   Peds  Hematology negative hematology ROS (+)   Anesthesia Other Findings Past Medical History: No date: Anxiety     Comment:  CONTROLLED ON PROZAC No date: Diabetes mellitus type II, non insulin dependent (HCC) No date: Epicondylitis syndrome of elbow     Comment:  LEFT, ARTHRITIS ANKLES,SHOULDERS,KNEES No date: Essential hypertension No date: Fatty liver No date: Hyperlipidemia No date: Hyperlipidemia associated with type 2 diabetes mellitus (HCC) No date: Motion sickness     Comment:  CAR No date: Restless leg syndrome No date: Sleep apnea     Comment:  S/P T&A AND SINUS SURGERY, USES CPAP No date: Squamous cell carcinoma in situ   Reproductive/Obstetrics negative OB ROS                             Anesthesia  Physical Anesthesia Plan  ASA: 2  Anesthesia Plan: General   Post-op Pain Management:    Induction: Intravenous  PONV Risk Score and Plan: 2 and Propofol infusion and TIVA  Airway Management Planned: Natural Airway and Nasal Cannula  Additional Equipment:   Intra-op Plan:   Post-operative Plan:   Informed Consent: I have reviewed the patients History and Physical, chart, labs and discussed the procedure including the risks, benefits and alternatives for the proposed anesthesia with the patient or authorized representative who has indicated his/her understanding and acceptance.     Dental Advisory Given  Plan Discussed with: Anesthesiologist, CRNA and Surgeon  Anesthesia Plan Comments:         Anesthesia Quick Evaluation

## 2022-12-10 NOTE — H&P (Signed)
Outpatient short stay form Pre-procedure 12/10/2022  Regis Bill, MD  Primary Physician: [facility]  Reason for visit:  Surveillance  History of present illness:    77 y/o gentleman with history of DM II and MASLD here for surveillance colonoscopy. Last colonoscopy in 2018 with small adenomatous polyps. No blood thinners. No family history of GI malignancies. No significant abdominal surgeries.    Current Facility-Administered Medications:    0.9 %  sodium chloride infusion, , Intravenous, Continuous, Renalda Locklin, Rossie Muskrat, MD  Medications Prior to Admission  Medication Sig Dispense Refill Last Dose   aspirin 81 MG tablet Take 81 mg by mouth daily. AM   Past Week   atorvastatin (LIPITOR) 20 MG tablet Take 20 mg by mouth daily. AM   12/09/2022   FLUoxetine (PROZAC) 40 MG capsule Take 40 mg by mouth daily. AM   12/09/2022   linaGLIPtin (TRADJENTA PO) Take 5 mg by mouth daily.      lisinopril (ZESTRIL) 10 MG tablet Take 10 mg by mouth daily.   12/09/2022   metFORMIN (GLUCOPHAGE) 1000 MG tablet Take 1,000 mg by mouth 2 (two) times daily.   12/09/2022   pioglitazone (ACTOS) 30 MG tablet Take 30 mg by mouth daily. AM   12/09/2022   pramipexole (MIRAPEX) 1 MG tablet Take 2 mg by mouth daily. PM   12/09/2022   ONETOUCH ULTRA test strip USE 3 (THREE) TIMES DAILY      TRULICITY 0.75 MG/0.5ML SOPN Inject 0.75 mg into the skin once a week. (Patient not taking: Reported on 12/10/2022)   Not Taking     No Known Allergies   Past Medical History:  Diagnosis Date   Anxiety    CONTROLLED ON PROZAC   Diabetes mellitus type II, non insulin dependent (HCC)    Epicondylitis syndrome of elbow    LEFT, ARTHRITIS ANKLES,SHOULDERS,KNEES   Essential hypertension    Fatty liver    Hyperlipidemia    Hyperlipidemia associated with type 2 diabetes mellitus (HCC)    Motion sickness    CAR   Restless leg syndrome    Sleep apnea    S/P T&A AND SINUS SURGERY, USES CPAP   Squamous cell  carcinoma in situ     Review of systems:  Otherwise negative.    Physical Exam  Gen: Alert, oriented. Appears stated age.  HEENT: PERRLA. Lungs: No respiratory distress CV: RRR Abd: soft, benign, no masses Ext: No edema    Planned procedures: Proceed with colonoscopy. The patient understands the nature of the planned procedure, indications, risks, alternatives and potential complications including but not limited to bleeding, infection, perforation, damage to internal organs and possible oversedation/side effects from anesthesia. The patient agrees and gives consent to proceed.  Please refer to procedure notes for findings, recommendations and patient disposition/instructions.     Regis Bill, MD [facility]

## 2022-12-10 NOTE — Anesthesia Postprocedure Evaluation (Signed)
Anesthesia Post Note  Patient: Alexander Hart  Procedure(s) Performed: COLONOSCOPY WITH PROPOFOL  Patient location during evaluation: Endoscopy Anesthesia Type: General Level of consciousness: awake and alert Pain management: pain level controlled Vital Signs Assessment: post-procedure vital signs reviewed and stable Respiratory status: spontaneous breathing, nonlabored ventilation, respiratory function stable and patient connected to nasal cannula oxygen Cardiovascular status: blood pressure returned to baseline and stable Postop Assessment: no apparent nausea or vomiting Anesthetic complications: no   No notable events documented.   Last Vitals:  Vitals:   12/10/22 1135 12/10/22 1145  BP: (!) 148/95 108/72  Pulse:    Resp:    Temp:    SpO2:      Last Pain:  Vitals:   12/10/22 1145  TempSrc:   PainSc: 0-No pain                 Lenard Simmer

## 2022-12-10 NOTE — Op Note (Signed)
[facility] Patient Name: Alexander Hart Procedure Date: 12/10/2022 10:53 AM MRN: [id number] Account #: [id number] Date of Birth: 1945-12-02 Admit Type: Outpatient Age: 77 Room: [facility] ENDO ROOM 3 Gender: Male Note Status: Finalized Instrument Name: Prentice Docker [id number] Procedure:             Colonoscopy Indications:           Surveillance: Personal history of adenomatous polyps                         on last colonoscopy 5 years ago Providers:             Eather Colas MD, MD Medicines:             Monitored Anesthesia Care Complications:         No immediate complications. Estimated blood loss:                         Minimal. Procedure:             Pre-Anesthesia Assessment:                        - Prior to the procedure, a History and Physical was                         performed, and patient medications and allergies were                         reviewed. The patient is competent. The risks and                         benefits of the procedure and the sedation options and                         risks were discussed with the patient. All questions                         were answered and informed consent was obtained.                         Patient identification and proposed procedure were                         verified by the physician, the nurse, the                         anesthesiologist, the anesthetist and the technician                         in the endoscopy suite. Mental Status Examination:                         alert and oriented. Airway Examination: normal                         oropharyngeal airway and neck mobility. Respiratory                         Examination: clear to auscultation. CV Examination:  normal. Prophylactic Antibiotics: The patient does not                         require prophylactic antibiotics. Prior                         Anticoagulants: The patient has taken no anticoagulant                          or antiplatelet agents. ASA Grade Assessment: II - A                         patient with mild systemic disease. After reviewing                         the risks and benefits, the patient was deemed in                         satisfactory condition to undergo the procedure. The                         anesthesia plan was to use monitored anesthesia care                         (MAC). Immediately prior to administration of                         medications, the patient was re-assessed for adequacy                         to receive sedatives. The heart rate, respiratory                         rate, oxygen saturations, blood pressure, adequacy of                         pulmonary ventilation, and response to care were                         monitored throughout the procedure. The physical                         status of the patient was re-assessed after the                         procedure.                        After obtaining informed consent, the colonoscope was                         passed under direct vision. Throughout the procedure,                         the patient's blood pressure, pulse, and oxygen                         saturations were monitored continuously. The  Colonoscope was introduced through the anus and                         advanced to the the cecum, identified by appendiceal                         orifice and ileocecal valve. The colonoscopy was                         performed without difficulty. The patient tolerated                         the procedure well. The quality of the bowel                         preparation was fair. The ileocecal valve, appendiceal                         orifice, and rectum were photographed. Findings:      The perianal and digital rectal examinations were normal.      A 3 mm polyp was found in the sigmoid colon. The polyp was sessile. The       polyp was removed with a  cold snare. Resection and retrieval were       complete. Estimated blood loss was minimal.      Multiple small-mouthed diverticula were found in the sigmoid colon.      A 2 mm polyp was found in the rectum. The polyp was sessile. The polyp       was removed with a cold snare. Resection and retrieval were complete.       Estimated blood loss was minimal.      Internal hemorrhoids were found during retroflexion. The hemorrhoids       were Grade I (internal hemorrhoids that do not prolapse).      The exam was otherwise without abnormality on direct and retroflexion       views. Impression:            - Preparation of the colon was fair.                        - One 3 mm polyp in the sigmoid colon, removed with a                         cold snare. Resected and retrieved.                        - Diverticulosis in the sigmoid colon.                        - One 2 mm polyp in the rectum, removed with a cold                         snare. Resected and retrieved.                        - Internal hemorrhoids.                        - The examination was otherwise normal on direct  and                         retroflexion views. Recommendation:        - Discharge patient to home.                        - Resume previous diet.                        - Continue present medications.                        - Await pathology results.                        - Repeat colonoscopy is not recommended due to current                         age (86 years or older) for surveillance.                        - Return to referring physician as previously                         scheduled. Procedure Code(s):     --- Professional ---                        [phone], Colonoscopy, flexible; with removal of                         tumor(s), polyp(s), or other lesion(s) by snare                         technique Diagnosis Code(s):     --- Professional ---                        Z86.010, Personal history of colonic  polyps                        K64.0, First degree hemorrhoids                        D12.5, Benign neoplasm of sigmoid colon                        D12.8, Benign neoplasm of rectum                        K57.30, Diverticulosis of large intestine without                         perforation or abscess without bleeding CPT copyright 2022 American Medical Association. All rights reserved. The codes documented in this report are preliminary and upon coder review may  be revised to meet current compliance requirements. Eather Colas MD, MD 12/10/2022 11:24:55 AM Number of Addenda: 0 Note Initiated On: 12/10/2022 10:53 AM Scope Withdrawal Time: 0 hours 10 minutes 22 seconds  Total Procedure Duration: 0 hours 16 minutes 50 seconds  Estimated Blood Loss:  Estimated blood loss was minimal.      [facility]

## 2022-12-10 NOTE — Transfer of Care (Signed)
Immediate Anesthesia Transfer of Care Note  Patient: Alexander Hart  Procedure(s) Performed: COLONOSCOPY WITH PROPOFOL  Patient Location: PACU  Anesthesia Type:General  Level of Consciousness: awake, oriented, and patient cooperative  Airway & Oxygen Therapy: Patient Spontanous Breathing  Post-op Assessment: Report given to RN and Post -op Vital signs reviewed and stable  Post vital signs: Reviewed and stable  Last Vitals:  Vitals Value Taken Time  BP 117/70 12/10/22 1125  Temp 35.7 C 12/10/22 1125  Pulse 65 12/10/22 1127  Resp 19 12/10/22 1127  SpO2 97 % 12/10/22 1127  Vitals shown include unvalidated device data.  Last Pain:  Vitals:   12/10/22 1125  TempSrc: Temporal  PainSc: Asleep         Complications: No notable events documented.

## 2022-12-13 ENCOUNTER — Encounter: Payer: Self-pay | Admitting: Gastroenterology

## 2022-12-14 LAB — SURGICAL PATHOLOGY

## 2023-05-02 ENCOUNTER — Other Ambulatory Visit: Payer: Self-pay

## 2023-05-02 ENCOUNTER — Ambulatory Visit
Admission: EM | Admit: 2023-05-02 | Discharge: 2023-05-02 | Disposition: A | Discharge status: Home / Self Care | Payer: Medicare Other | Attending: Emergency Medicine | Admitting: Emergency Medicine

## 2023-05-02 DIAGNOSIS — S61211A Laceration without foreign body of left index finger without damage to nail, initial encounter: Secondary | ICD-10-CM | POA: Diagnosis not present

## 2023-05-02 DIAGNOSIS — T148XXA Other injury of unspecified body region, initial encounter: Secondary | ICD-10-CM

## 2023-05-02 MED ORDER — LIDOCAINE HCL (PF) 1 % IJ SOLN: 10.0000 mL | Freq: Once | INTRAMUSCULAR | Status: DC

## 2023-05-02 NOTE — Discharge Instructions (Signed)
Keep wound clean and dry, remove sutures in 10-12 days. Dermabond will fall off in 3-5 days

## 2023-05-02 NOTE — ED Provider Notes (Signed)
[facility]    CSN: [id number] Arrival date & time: 05/02/23  1518      History   Chief Complaint Chief Complaint  Patient presents with  . finger laceration    HPI Alexander Hart is a 77 y.o. male.   77 year old male pt, Alexander Hart, presents to urgent care for evaluation of left index finger, cut on electric shaver PTA. Pt states tetanus is less than 5 years, UTD. Bleeding controlled. Pt also requesting splinter removal in right index finger.  The history is provided by the patient. No language interpreter was used.    Past Medical History:  Diagnosis Date  . Anxiety    CONTROLLED ON PROZAC  . Diabetes mellitus type II, non insulin dependent (HCC)   . Epicondylitis syndrome of elbow    LEFT, ARTHRITIS ANKLES,SHOULDERS,KNEES  . Essential hypertension   . Fatty liver   . Hyperlipidemia   . Hyperlipidemia associated with type 2 diabetes mellitus (HCC)   . Motion sickness    CAR  . Restless leg syndrome   . Sleep apnea    S/P T&A AND SINUS SURGERY, USES CPAP  . Squamous cell carcinoma in situ     Patient Active Problem List   Diagnosis Date Noted  . Laceration of left index finger without foreign body without damage to nail 05/02/2023  . Splinter in skin 05/02/2023  . Viral URI with cough 07/09/2021  . Chest pain with moderate risk for cardiac etiology 05/07/2018  . Hyperlipidemia associated with type 2 diabetes mellitus (HCC)   . Diabetes mellitus type II, non insulin dependent (HCC)   . Essential hypertension     Past Surgical History:  Procedure Laterality Date  . ANTERIOR FUSION CERVICAL SPINE     C5-6  . CATARACT EXTRACTION W/PHACO Right 05/09/2017   Procedure: CATARACT EXTRACTION PHACO AND INTRAOCULAR LENS PLACEMENT (IOC) RIGHT DIABETIC;  Surgeon: Nevada Crane, MD;  Location: [facility];  Service: Ophthalmology;  Laterality: Right;  Diabetic - oral meds sleep apnea  . CATARACT EXTRACTION W/PHACO Left 06/21/2017    Procedure: CATARACT EXTRACTION PHACO AND INTRAOCULAR LENS PLACEMENT (IOC)  LEFT DIABETIC;  Surgeon: Nevada Crane, MD;  Location: [facility];  Service: Ophthalmology;  Laterality: Left;  Diabetic - oral meds sleep apnea  . COLONOSCOPY WITH PROPOFOL N/A 12/10/2022   Procedure: COLONOSCOPY WITH PROPOFOL;  Surgeon: Regis Bill, MD;  Location: [facility] END[facility]COPY;  Service: Endoscopy;  Laterality: N/A;  . EYE SURGERY    . FOOT FRACTURE SURGERY Left   . FRACTURE SURGERY    . NASAL TURBINATE REDUCTION    . TENNIS ELBOW RELEASE/NIRSCHEL PROCEDURE Left 05/23/2015   Procedure: TENNIS ELBOW RELEASE/NIRSCHEL PROCEDURE;  Surgeon: Erin Sons, MD;  Location: [facility];  Service: Orthopedics;  Laterality: Left;  CPAP AND DIABETIC  . TONSILLECTOMY    . TRIGGER FINGER RELEASE Bilateral 07/27/2003       Home Medications    Prior to Admission medications   Medication Sig Start Date End Date Taking? Authorizing Provider  atorvastatin (LIPITOR) 20 MG tablet Take 20 mg by mouth daily. AM   Yes [provider]  FLUoxetine (PROZAC) 40 MG capsule Take 40 mg by mouth daily. AM   Yes [provider]  linaGLIPtin (TRADJENTA PO) Take 5 mg by mouth daily.   Yes [provider]  lisinopril (ZESTRIL) 10 MG tablet Take 10 mg by mouth daily. 08/24/19  Yes [provider]  metFORMIN (GLUCOPHAGE) 1000 MG  tablet Take 1,000 mg by mouth 2 (two) times daily. 07/09/19  Yes [provider]  ONETOUCH ULTRA test strip USE 3 (THREE) TIMES DAILY 10/08/19  Yes [provider]  pioglitazone (ACTOS) 30 MG tablet Take 30 mg by mouth daily. AM   Yes [provider]  pramipexole (MIRAPEX) 1 MG tablet Take 2 mg by mouth daily. PM   Yes [provider]  aspirin 81 MG tablet Take 81 mg by mouth daily. AM    [provider]  TRULICITY 0.75 MG/0.5ML SOPN Inject 0.75 mg into the skin once a week. Patient not taking: Reported on  12/10/2022 01/07/22   [provider]    Family History No family history on file.  Social History Social History   Tobacco Use  . Smoking status: Never  . Smokeless tobacco: Never  Vaping Use  . Vaping status: Never Used  Substance Use Topics  . Alcohol use: Yes    Comment: 1/ MONTH WINE  . Drug use: No     Allergies   Patient has no known allergies.   Review of Systems Review of Systems  Skin:  Positive for wound.  All other systems reviewed and are negative.    Physical Exam Triage Vital Signs ED Triage Vitals  Encounter Vitals Group     BP      Systolic BP Percentile      Diastolic BP Percentile      Pulse      Resp      Temp      Temp src      SpO2      Weight      Height      Head Circumference      Peak Flow      Pain Score      Pain Loc      Pain Education      Exclude from Growth Chart    No data found.  Updated Vital Signs BP 116/66   Pulse 82   Temp 97.7 F (36.5 C)   Resp 20   SpO2 100%   Visual Acuity Right Eye Distance:   Left Eye Distance:   Bilateral Distance:    Right Eye Near:   Left Eye Near:    Bilateral Near:     Physical Exam Vitals and nursing note reviewed.  Constitutional:      General: He is not in acute distress.    Appearance: He is well-developed and well-groomed.  HENT:     Head: Normocephalic and atraumatic.  Eyes:     Conjunctiva/sclera: Conjunctivae normal.  Cardiovascular:     Rate and Rhythm: Normal rate and regular rhythm.     Heart sounds: No murmur heard. Pulmonary:     Effort: Pulmonary effort is normal. No respiratory distress.     Breath sounds: Normal breath sounds.  Abdominal:     Palpations: Abdomen is soft.     Tenderness: There is no abdominal tenderness.  Musculoskeletal:        General: No swelling.     Cervical back: Neck supple.  Skin:    General: Skin is warm and dry.     Capillary Refill: Capillary refill takes less than 2 seconds.     Findings: Signs of injury,  laceration and wound present.     Comments: Pt has ~3 cm laceration to palmar distal aspect of 2nd(index finger). Full ROM.  Pt has splinter in right index finger,distal aspect  Neurological:  General: No focal deficit present.     Mental Status: He is alert and oriented to person, place, and time.     GCS: GCS eye subscore is 4. GCS verbal subscore is 5. GCS motor subscore is 6.     Cranial Nerves: No cranial nerve deficit.     Sensory: No sensory deficit.  Psychiatric:        Attention and Perception: Attention normal.        Mood and Affect: Mood normal.        Speech: Speech normal.        Behavior: Behavior normal. Behavior is cooperative.     UC Treatments / Results  Labs (all labs ordered are listed, but only abnormal results are displayed) Labs Reviewed - No data to display  EKG   Radiology No results found.  Procedures Laceration Repair  Date/Time: 05/02/2023 3:46 PM  Performed by: Clancy Gourd, NP Authorized by: Clancy Gourd, NP   Consent:    Consent obtained:  Verbal   Consent given by:  Patient   Risks, benefits, and alternatives were discussed: yes     Risks discussed:  Infection, pain, poor cosmetic result and poor wound healing Universal protocol:    Procedure explained and questions answered to patient or proxy's satisfaction: yes     Site/side marked: yes     Immediately prior to procedure, a time out was called: yes     Patient identity confirmed:  Verbally with patient, arm band, provided demographic data and hospital-assigned identification number Anesthesia:    Anesthesia method:  Nerve block   Block location:  Left index finger   Block anesthetic:  Lidocaine 1% w/o epi   Block injection procedure:  Anatomic landmarks identified, anatomic landmarks palpated, introduced needle, negative aspiration for blood and incremental injection   Block outcome:  Anesthesia achieved Laceration details:    Location:  Finger   Finger location:   L index finger   Length (cm):  3 Pre-procedure details:    Preparation:  Patient was prepped and draped in usual sterile fashion Exploration:    Limited defect created (wound extended): no     Hemostasis achieved with:  Direct pressure   Imaging outcome: foreign body not noted     Wound exploration: wound explored through full range of motion and entire depth of wound visualized     Wound extent: no foreign bodies/material noted     Contaminated: no   Treatment:    Area cleansed with:  Povidone-iodine   Amount of cleaning:  Extensive   Irrigation solution:  Sterile saline   Irrigation method:  Syringe   Visualized foreign bodies/material removed: no     Debridement:  None   Undermining:  None Skin repair:    Repair method:  Sutures   Suture size:  5-0   Suture material:  Nylon   Suture technique:  Simple interrupted   Number of sutures:  5 Approximation:    Approximation:  Close Repair type:    Repair type:  Simple Post-procedure details:    Dressing:  Antibiotic ointment, non-adherent dressing and bulky dressing   Procedure completion:  Tolerated well, no immediate complications  (including critical care time)  Medications Ordered in UC Medications  lidocaine (PF) (XYLOCAINE) 1 % injection 10 mL (has no administration in time range)    Initial Impression / Assessment and Plan / UC Course  I have reviewed the triage vital signs and the nursing notes.  Pertinent labs & imaging results  that were available during my care of the patient were reviewed by me and considered in my medical decision making (see chart for details).  Clinical Course as of 05/02/23 2117  Mon May 02, 2023  1630 Simple splinter removal from right index finger, pt tolerated well [JD]    Clinical Course User Index [JD] Amiaya Mcneeley, Para March, NP    Ddx: Finger laceration, splinter Final Clinical Impressions(s) / UC Diagnoses   Final diagnoses:  Laceration of left index finger without foreign body  without damage to nail, initial encounter  Splinter in skin     Discharge Instructions      Keep wound clean and dry, remove sutures in 10-12 days. Dermabond will fall off in 3-5 days     ED Prescriptions   None    PDMP not reviewed this encounter.   Clancy Gourd, NP 05/02/23 2116

## 2023-05-02 NOTE — ED Triage Notes (Signed)
Cut left 2nd digit left hand with electric shaver. Bleeding controlled. Wound approx 2 - 3 cm in length.

## 2023-09-29 ENCOUNTER — Ambulatory Visit (INDEPENDENT_AMBULATORY_CARE_PROVIDER_SITE_OTHER)

## 2023-09-29 ENCOUNTER — Encounter: Payer: Self-pay | Admitting: Emergency Medicine

## 2023-09-29 ENCOUNTER — Ambulatory Visit
Admission: EM | Admit: 2023-09-29 | Discharge: 2023-09-29 | Disposition: A | Discharge status: Home / Self Care | Attending: Internal Medicine | Admitting: Internal Medicine

## 2023-09-29 DIAGNOSIS — M25511 Pain in right shoulder: Secondary | ICD-10-CM

## 2023-09-29 DIAGNOSIS — M546 Pain in thoracic spine: Secondary | ICD-10-CM

## 2023-09-29 DIAGNOSIS — S2231XA Fracture of one rib, right side, initial encounter for closed fracture: Secondary | ICD-10-CM | POA: Diagnosis not present

## 2023-09-29 DIAGNOSIS — R0781 Pleurodynia: Secondary | ICD-10-CM

## 2023-09-29 MED ORDER — HYDROCODONE-ACETAMINOPHEN 5-325 MG PO TABS
1.0000 | ORAL_TABLET | Freq: Four times a day (QID) | ORAL | 0 refills | Status: DC | PRN
Start: 2023-09-29 — End: 2023-10-17

## 2023-09-29 NOTE — ED Provider Notes (Signed)
 [facility]    CSN: [id number] Arrival date & time: 09/29/23  1420      History   Chief Complaint Chief Complaint  Patient presents with   Fall    HPI Alexander Hart is a 78 y.o. male who presents with with wife due to having R side shoulder and abdomen pain since he fell off the shower 2 days ago. States he slipped in the shower and fell back hitting his R shoulder and R lateral rib area on the edge of the tub and back landed on her hard floor. He did hit his head, but denies LOC or vomiting/  Has been taking ibuprofen for pain. Has not taken anything today.  Denies noticing change in urine color or blood in urine.    Past Medical History:  Diagnosis Date   Anxiety    CONTROLLED ON PROZAC   Diabetes mellitus type II, non insulin dependent (HCC)    Epicondylitis syndrome of elbow    LEFT, ARTHRITIS ANKLES,SHOULDERS,KNEES   Essential hypertension    Fatty liver    Hyperlipidemia    Hyperlipidemia associated with type 2 diabetes mellitus (HCC)    Motion sickness    CAR   Restless leg syndrome    Sleep apnea    S/P T&A AND SINUS SURGERY, USES CPAP   Squamous cell carcinoma in situ     Patient Active Problem List   Diagnosis Date Noted   Laceration of left index finger without foreign body without damage to nail 05/02/2023   Splinter in skin 05/02/2023   Viral URI with cough 07/09/2021   Chest pain with moderate risk for cardiac etiology 05/07/2018   Hyperlipidemia associated with type 2 diabetes mellitus (HCC)    Diabetes mellitus type II, non insulin dependent (HCC)    Essential hypertension     Past Surgical History:  Procedure Laterality Date   ANTERIOR FUSION CERVICAL SPINE     C5-6   CATARACT EXTRACTION W/PHACO Right 05/09/2017   Procedure: CATARACT EXTRACTION PHACO AND INTRAOCULAR LENS PLACEMENT (IOC) RIGHT DIABETIC;  Surgeon: Nevada Crane, MD;  Location: [facility];  Service: Ophthalmology;  Laterality: Right;  Diabetic - oral  meds sleep apnea   CATARACT EXTRACTION W/PHACO Left 06/21/2017   Procedure: CATARACT EXTRACTION PHACO AND INTRAOCULAR LENS PLACEMENT (IOC)  LEFT DIABETIC;  Surgeon: Nevada Crane, MD;  Location: [facility];  Service: Ophthalmology;  Laterality: Left;  Diabetic - oral meds sleep apnea   COLONOSCOPY WITH PROPOFOL N/A 12/10/2022   Procedure: COLONOSCOPY WITH PROPOFOL;  Surgeon: Regis Bill, MD;  Location: [facility] ENDOSCOPY;  Service: Endoscopy;  Laterality: N/A;   EYE SURGERY     FOOT FRACTURE SURGERY Left    FRACTURE SURGERY     NASAL TURBINATE REDUCTION     TENNIS ELBOW RELEASE/NIRSCHEL PROCEDURE Left 05/23/2015   Procedure: TENNIS ELBOW RELEASE/NIRSCHEL PROCEDURE;  Surgeon: Erin Sons, MD;  Location: [facility];  Service: Orthopedics;  Laterality: Left;  CPAP AND DIABETIC   TONSILLECTOMY     TRIGGER FINGER RELEASE Bilateral 07/27/2003   Home Medications    Prior to Admission medications   Medication Sig Start Date End Date Taking? Authorizing Provider  Fluoxetine HCl, PMDD, 20 MG TABS Take by mouth. 10/12/12  Yes [provider]  gabapentin (NEURONTIN) 300 MG capsule Take by mouth. 09/09/22  Yes [provider]  glipiZIDE (GLUCOTROL XL) 10 MG 24 hr tablet Take by mouth. 04/12/23 04/11/24 Yes [provider]  HYDROcodone-acetaminophen (NORCO/VICODIN)  5-325 MG tablet Take 1 tablet by mouth every 6 (six) hours as needed for moderate pain (pain score 4-6). 09/29/23  Yes Rodriguez-Southworth, Nettie Elm, PA-C  linaGLIPtin-metFORMIN HCl (JENTADUETO) 2.11-998 MG TABS daily. 09/22/12  Yes [provider]  aspirin 81 MG tablet Take 81 mg by mouth daily. AM    [provider]  atorvastatin (LIPITOR) 20 MG tablet Take 20 mg by mouth daily. AM    [provider]  cyanocobalamin (VITAMIN B12) 1000 MCG tablet Take by mouth.    [provider]  FLUoxetine (PROZAC) 40 MG capsule Take 40 mg by mouth daily. AM    [provider]  linaGLIPtin (TRADJENTA PO) Take 5 mg by mouth daily.    [provider]  lisinopril (ZESTRIL) 10 MG tablet Take 10 mg by mouth daily. 08/24/19   [provider]  metFORMIN (GLUCOPHAGE) 1000 MG tablet Take 1,000 mg by mouth 2 (two) times daily. 07/09/19   [provider]  ONETOUCH ULTRA test strip USE 3 (THREE) TIMES DAILY 10/08/19   [provider]  pioglitazone (ACTOS) 30 MG tablet Take 30 mg by mouth daily. AM    [provider]  pramipexole (MIRAPEX) 1 MG tablet Take 2 mg by mouth daily. PM    [provider]  Semaglutide, 1 MG/DOSE, (OZEMPIC, 1 MG/DOSE,) 2 MG/1.5ML SOPN Inject into the skin.    [provider]    Family History History reviewed. No pertinent family history.  Social History Social History   Tobacco Use   Smoking status: Never   Smokeless tobacco: Never  Vaping Use   Vaping status: Never Used  Substance Use Topics   Alcohol use: Yes    Comment: 1/ MONTH WINE   Drug use: No     Allergies   Patient has no known allergies.   Review of Systems Review of Systems  As noted in HPI Physical Exam Triage Vital Signs ED Triage Vitals  Encounter Vitals Group     BP 09/29/23 1447 112/78     Systolic BP Percentile --      Diastolic BP Percentile --      Pulse Rate 09/29/23 1445 71     Resp 09/29/23 1445 18     Temp 09/29/23 1445 98 F (36.7 C)     Temp Source 09/29/23 1445 Oral     SpO2 --      Weight --      Height --      Head Circumference --      Peak Flow --      Pain Score 09/29/23 1443 10     Pain Loc --      Pain Education --      Exclude from Growth Chart --    No data found.  Updated Vital Signs BP 112/78 (BP Location: Left Arm)   Pulse 71   Temp 98 F (36.7 C) (Oral)   Resp 18   Visual Acuity Right Eye Distance:   Left Eye Distance:   Bilateral Distance:    Right Eye Near:   Left Eye Near:    Bilateral Near:     Physical Exam Vitals and nursing note  reviewed.  Constitutional:      General: He is not in acute distress.    Appearance: He is not toxic-appearing.  HENT:     Head: Atraumatic.     Right Ear: External ear normal.     Left Ear: External ear normal.  Eyes:  General: Lids are normal. No scleral icterus.    Conjunctiva/sclera: Conjunctivae normal.     Pupils: Pupils are equal, round, and reactive to light.  Neck:     Comments: No C spine vertebral tenderness Pulmonary:     Comments: Poor effort since deep breaths provoke pain on his R lateral ribs. Has point tenderness on R mid lateral ribs and anterior lower. No crepitations noted.  Chest:     Chest wall: Tenderness present.  Abdominal:     General: Bowel sounds are normal.     Palpations: Abdomen is soft.     Tenderness: There is no abdominal tenderness. There is no guarding or rebound.  Musculoskeletal:     Cervical back: Neck supple. No rigidity or tenderness.     Comments: R SHOULDER- has no deformity or tenderness of his clavicle. Has tenderness on R upper lateral shoulder. ROM is decreased due to pain.   BACK- has tender T 2, but the rest of thoracic and lumbar spine is non tender.   Skin:    General: Skin is warm and dry.     Findings: No bruising, erythema, lesion or rash.  Neurological:     Mental Status: He is alert and oriented to person, place, and time.     Gait: Gait normal.  Psychiatric:        Mood and Affect: Mood normal.        Behavior: Behavior normal.        Thought Content: Thought content normal.        Judgment: Judgment normal.    UC Treatments / Results  Labs (all labs ordered are listed, but only abnormal results are displayed) Labs Reviewed - No data to display  EKG   Radiology DG Thoracic Spine 2 View Result Date: 09/29/2023 CLINICAL DATA:  Point tenderness at the T12 level following a fall. EXAM: THORACIC SPINE 2 VIEWS COMPARISON:  Chest radiographs dated 05/06/2018. FINDINGS: Mild-to-moderate degenerative spur formation  throughout the thoracic spine with progression. No fracture or subluxation. Stable cervical spine fixation hardware. Bilateral carotid artery atheromatous calcifications. IMPRESSION: 1. No fracture. 2. Mild-to-moderate degenerative changes with progression. 3. Bilateral carotid artery atheromatous calcifications. Electronically Signed   By: Beckie Salts M.D.   On: 09/29/2023 18:24   DG Ribs Unilateral W/Chest Right Result Date: 09/29/2023 CLINICAL DATA:  Right rib pain following a fall. EXAM: RIGHT RIBS AND CHEST - 3+ VIEW COMPARISON:  Chest radiographs dated 07/16/2021. FINDINGS: Minimally displaced right anterior 7th rib fracture. No pneumothorax. Clear right lung. Thoracic spine degenerative changes and cervical spine fixation hardware. Cervical spine degenerative changes and right carotid artery calcifications. IMPRESSION: Minimally displaced right anterior 7th rib fracture. Electronically Signed   By: Beckie Salts M.D.   On: 09/29/2023 18:23   DG Shoulder Right Result Date: 09/29/2023 CLINICAL DATA:  Right shoulder pain following a fall. EXAM: RIGHT SHOULDER - 2+ VIEW COMPARISON:  None Available. FINDINGS: Mild-to-moderate right glenohumeral and AC joint degenerative spur formation. No fracture or dislocation. Cervical spine fixation hardware. Thoracic spine degenerative changes. Right carotid artery atheromatous calcifications. IMPRESSION: 1. No fracture or dislocation. 2. Mild-to-moderate right glenohumeral and AC joint degenerative changes. 3. Right carotid artery atheromatous calcifications. Electronically Signed   By: Beckie Salts M.D.   On: 09/29/2023 18:20    Procedures Procedures (including critical care time)  Medications Ordered in UC Medications - No data to display  Initial Impression / Assessment and Plan / UC Course  I have reviewed the triage vital  signs and the nursing notes.  Pertinent  imaging results that were available during my care of the patient were reviewed by me and  considered in my medical decision making (see chart for details).  Fractured 7th rib R shoulder strain Upper back contusion   I placed him on Norco as noted Needs to  FU with [facility] in 2 days.    Final Clinical Impressions(s) / UC Diagnoses   Final diagnoses:  Rib pain on right side  Acute pain of right shoulder  Thoracic spine pain  Closed fracture of one rib of right side, initial encounter   Discharge Instructions   None    ED Prescriptions     Medication Sig Dispense Auth. Provider   HYDROcodone-acetaminophen (NORCO/VICODIN) 5-325 MG tablet Take 1 tablet by mouth every 6 (six) hours as needed for moderate pain (pain score 4-6). 15 tablet Rodriguez-Southworth, Nettie Elm, PA-C      I have reviewed the PDMP during this encounter.   Garey Ham, PA-C 09/29/23 2018

## 2023-09-29 NOTE — ED Triage Notes (Addendum)
 Pt slipped taking a shower 2 days ago. He c/o right side pain from his shoulder to his abdomen. Pt reports that it hurts to take a deep breath in.

## 2023-10-17 ENCOUNTER — Ambulatory Visit (INDEPENDENT_AMBULATORY_CARE_PROVIDER_SITE_OTHER)

## 2023-10-17 ENCOUNTER — Ambulatory Visit
Admission: EM | Admit: 2023-10-17 | Discharge: 2023-10-17 | Disposition: A | Discharge status: Home / Self Care | Attending: Physician Assistant | Admitting: Physician Assistant

## 2023-10-17 DIAGNOSIS — W19XXXA Unspecified fall, initial encounter: Secondary | ICD-10-CM | POA: Diagnosis not present

## 2023-10-17 DIAGNOSIS — M25521 Pain in right elbow: Secondary | ICD-10-CM | POA: Diagnosis not present

## 2023-10-17 DIAGNOSIS — S42491A Other displaced fracture of lower end of right humerus, initial encounter for closed fracture: Secondary | ICD-10-CM | POA: Diagnosis not present

## 2023-10-17 DIAGNOSIS — S0990XA Unspecified injury of head, initial encounter: Secondary | ICD-10-CM | POA: Diagnosis not present

## 2023-10-17 MED ORDER — KETOROLAC TROMETHAMINE 60 MG/2ML IM SOLN
30.0000 mg | Freq: Once | INTRAMUSCULAR | Status: AC
  Administered 2023-10-17: 30 mg via INTRAMUSCULAR

## 2023-10-17 MED ORDER — HYDROCODONE-ACETAMINOPHEN 5-325 MG PO TABS: 1.0000 | ORAL_TABLET | Freq: Four times a day (QID) | ORAL | 0 refills | Status: AC | PRN

## 2023-10-17 NOTE — Discharge Instructions (Addendum)
-  You have a significant fracture of your elbow.  Wear the splint and do not get it wet. - Try to follow-up with orthopedics in the next few days. - Do not use affected arm until cleared by orthopedics.  May contact one of the offices below or request referral through PCP. - Sent pain medicine for you to use as needed.  You have a condition requiring you to follow up with Orthopedics so please call one of the following office for appointment:   [facility] Address: [address] [place], [place] [address] Phone: [phone]  [facility] [address] [place], [place], [place] [address] Phone: [phone]  [facility] [address] [place], [place] [address] Phone: [phone]   *In regards to head injury, appears to be minor but very close monitoring.  If pain worsens, you feel dizzy, weak, have vomiting, difficulty walking, speaking, numbness/tingling, weakness, changes in behavior, confusion please seek immediate reevaluation in the emergency department.

## 2023-10-17 NOTE — ED Provider Notes (Signed)
 [facility]    CSN: [id number] Arrival date & time: 10/17/23  1633      History   Chief Complaint Chief Complaint  Patient presents with   Elbow Injury    HPI Alexander Hart is a 78 y.o. male presenting for injury of right elbow.  Patient's wife is present with him today.  Patient reports slipping on mud and falling onto the right elbow.  Reports swelling and difficulty straightening and fully flexing elbow.  Pain radiates into forearm.  Also reports that he hit the top of his head on a brick.  Has mild swelling and contusion.  Denies loss of consciousness.  Denies headache, dizziness, weakness, numbness/tingling, vomiting, confusion, gait or speech problems.  Patient did fall on 09/29/2023 and fractured a rib on the right side and also injured his right shoulder.  He is still having some discomfort in these areas but denies any worsening of that discomfort after most recent fall.  Those symptoms have actually improved since his previous fall.  He denies taking any anticoagulants.  No other concerns.  HPI  Past Medical History:  Diagnosis Date   Anxiety    CONTROLLED ON PROZAC   Diabetes mellitus type II, non insulin dependent (HCC)    Epicondylitis syndrome of elbow    LEFT, ARTHRITIS ANKLES,SHOULDERS,KNEES   Essential hypertension    Fatty liver    Hyperlipidemia    Hyperlipidemia associated with type 2 diabetes mellitus (HCC)    Motion sickness    CAR   Restless leg syndrome    Sleep apnea    S/P T&A AND SINUS SURGERY, USES CPAP   Squamous cell carcinoma in situ     Patient Active Problem List   Diagnosis Date Noted   Laceration of left index finger without foreign body without damage to nail 05/02/2023   Splinter in skin 05/02/2023   Viral URI with cough 07/09/2021   Chest pain with moderate risk for cardiac etiology 05/07/2018   Hyperlipidemia associated with type 2 diabetes mellitus (HCC)    Diabetes mellitus type II, non insulin dependent (HCC)     Essential hypertension     Past Surgical History:  Procedure Laterality Date   ANTERIOR FUSION CERVICAL SPINE     C5-6   CATARACT EXTRACTION W/PHACO Right 05/09/2017   Procedure: CATARACT EXTRACTION PHACO AND INTRAOCULAR LENS PLACEMENT (IOC) RIGHT DIABETIC;  Surgeon: Nevada Crane, MD;  Location: [facility];  Service: Ophthalmology;  Laterality: Right;  Diabetic - oral meds sleep apnea   CATARACT EXTRACTION W/PHACO Left 06/21/2017   Procedure: CATARACT EXTRACTION PHACO AND INTRAOCULAR LENS PLACEMENT (IOC)  LEFT DIABETIC;  Surgeon: Nevada Crane, MD;  Location: [facility];  Service: Ophthalmology;  Laterality: Left;  Diabetic - oral meds sleep apnea   COLONOSCOPY WITH PROPOFOL N/A 12/10/2022   Procedure: COLONOSCOPY WITH PROPOFOL;  Surgeon: Regis Bill, MD;  Location: [facility];  Service: Endoscopy;  Laterality: N/A;   EYE SURGERY     FOOT FRACTURE SURGERY Left    FRACTURE SURGERY     NASAL TURBINATE REDUCTION     TENNIS ELBOW RELEASE/NIRSCHEL PROCEDURE Left 05/23/2015   Procedure: TENNIS ELBOW RELEASE/NIRSCHEL PROCEDURE;  Surgeon: Erin Sons, MD;  Location: [facility];  Service: Orthopedics;  Laterality: Left;  CPAP AND DIABETIC   TONSILLECTOMY     TRIGGER FINGER RELEASE Bilateral 07/27/2003       Home Medications    Prior to Admission medications   Medication Sig Start Date  End Date Taking? Authorizing Provider  aspirin 81 MG tablet Take 81 mg by mouth daily. AM   Yes [provider]  atorvastatin (LIPITOR) 20 MG tablet Take 20 mg by mouth daily. AM   Yes [provider]  cyanocobalamin (VITAMIN B12) 1000 MCG tablet Take by mouth.   Yes [provider]  FLUoxetine (PROZAC) 40 MG capsule Take 40 mg by mouth daily. AM   Yes [provider]  Fluoxetine HCl, PMDD, 20 MG TABS Take by mouth. 10/12/12  Yes [provider]  gabapentin (NEURONTIN) 300 MG capsule Take by mouth. 09/09/22  Yes  [provider]  glipiZIDE (GLUCOTROL XL) 10 MG 24 hr tablet Take by mouth. 04/12/23 04/11/24 Yes [provider]  HYDROcodone-acetaminophen (NORCO/VICODIN) 5-325 MG tablet Take 1 tablet by mouth every 6 (six) hours as needed for up to 5 days. 10/17/23 10/22/23 Yes Eusebio Friendly B, PA-C  linaGLIPtin (TRADJENTA PO) Take 5 mg by mouth daily.   Yes [provider]  linaGLIPtin-metFORMIN HCl (JENTADUETO) 2.11-998 MG TABS daily. 09/22/12  Yes [provider]  lisinopril (ZESTRIL) 10 MG tablet Take 10 mg by mouth daily. 08/24/19  Yes [provider]  metFORMIN (GLUCOPHAGE) 1000 MG tablet Take 1,000 mg by mouth 2 (two) times daily. 07/09/19  Yes [provider]  ONETOUCH ULTRA test strip USE 3 (THREE) TIMES DAILY 10/08/19  Yes [provider]  pioglitazone (ACTOS) 30 MG tablet Take 30 mg by mouth daily. AM   Yes [provider]  pramipexole (MIRAPEX) 1 MG tablet Take 2 mg by mouth daily. PM   Yes [provider]  Semaglutide, 1 MG/DOSE, (OZEMPIC, 1 MG/DOSE,) 2 MG/1.5ML SOPN Inject into the skin.   Yes [provider]    Family History History reviewed. No pertinent family history.  Social History Social History   Tobacco Use   Smoking status: Never   Smokeless tobacco: Never  Vaping Use   Vaping status: Never Used  Substance Use Topics   Alcohol use: Yes    Comment: 1/ MONTH WINE   Drug use: No     Allergies   Patient has no known allergies.   Review of Systems Review of Systems  Constitutional:  Negative for fatigue.  Eyes:  Negative for photophobia and visual disturbance.  Respiratory:  Negative for shortness of breath.   Cardiovascular:  Negative for chest pain.  Musculoskeletal:  Positive for arthralgias and joint swelling. Negative for back pain, gait problem, neck pain and neck stiffness.  Skin:  Positive for color change. Negative for wound.  Neurological:  Negative for dizziness, syncope,  facial asymmetry, speech difficulty, weakness, light-headedness, numbness and headaches.  Hematological:  Does not bruise/bleed easily.  Psychiatric/Behavioral:  Negative for confusion.      Physical Exam Triage Vital Signs ED Triage Vitals  Encounter Vitals Group     BP 10/17/23 1641 129/76     Systolic BP Percentile --      Diastolic BP Percentile --      Pulse Rate 10/17/23 1641 60     Resp 10/17/23 1641 16     Temp 10/17/23 1641 97.7 F (36.5 C)     Temp Source 10/17/23 1641 Oral     SpO2 10/17/23 1641 98 %     Weight 10/17/23 1640 200 lb (90.7 kg)     Height 10/17/23 1640 5\' 9"  (1.753 m)     Head Circumference --      Peak Flow --  Pain Score 10/17/23 1640 10     Pain Loc --      Pain Education --      Exclude from Growth Chart --    No data found.  Updated Vital Signs BP 129/76 (BP Location: Left Arm)   Pulse 60   Temp 97.7 F (36.5 C) (Oral)   Resp 16   Ht 5\' 9"  (1.753 m)   Wt 200 lb (90.7 kg)   SpO2 98%   BMI 29.53 kg/m   Visual Acuity Right Eye Distance:   Left Eye Distance:   Bilateral Distance:    Physical Exam Vitals and nursing note reviewed.  Constitutional:      General: He is not in acute distress.    Appearance: Normal appearance. He is well-developed. He is not ill-appearing.  HENT:     Head:     Comments: Contusion/small hematoma/swelling central parietal bone    Right Ear: Tympanic membrane, ear canal and external ear normal.     Left Ear: Tympanic membrane, ear canal and external ear normal.     Nose: Nose normal.     Mouth/Throat:     Mouth: Mucous membranes are moist.     Pharynx: Oropharynx is clear.  Eyes:     General: No scleral icterus.    Conjunctiva/sclera: Conjunctivae normal.     Pupils: Pupils are equal, round, and reactive to light.  Cardiovascular:     Rate and Rhythm: Normal rate and regular rhythm.     Heart sounds: Normal heart sounds.  Pulmonary:     Effort: Pulmonary effort is normal. No respiratory  distress.     Breath sounds: Normal breath sounds.  Chest:     Chest wall: Tenderness (right lateral ribs) present.  Musculoskeletal:     Right elbow: Swelling (lateral elbow) present. Decreased range of motion. Tenderness (lateral elbow/lateral epicondyle) present in lateral epicondyle.     Right forearm: Tenderness (mid radius and ulna) present. No swelling.     Right wrist: Normal.     Right hand: Normal.     Cervical back: Neck supple. No bony tenderness. No pain with movement.  Skin:    General: Skin is warm and dry.     Capillary Refill: Capillary refill takes less than 2 seconds.  Neurological:     General: No focal deficit present.     Mental Status: He is alert and oriented to person, place, and time. Mental status is at baseline.     Cranial Nerves: No cranial nerve deficit.     Motor: No weakness.     Coordination: Coordination normal.     Gait: Gait normal.  Psychiatric:        Mood and Affect: Mood normal.        Behavior: Behavior normal.      UC Treatments / Results  Labs (all labs ordered are listed, but only abnormal results are displayed) Labs Reviewed - No data to display  EKG   Radiology DG Elbow Complete Right Result Date: 10/17/2023 CLINICAL DATA:  Fall onto right arm.  Pain. EXAM: RIGHT ELBOW - COMPLETE 3+ VIEW; RIGHT FOREARM - 2 VIEW COMPARISON:  None Available. FINDINGS: Right elbow: There is minimal elevation of the distal anterior humeral fat pad suggesting a small elbow joint effusion. Diffuse decreased bone mineralization. There is linear lucency indicating an acute fracture of the distal humerus. This fracture line extends from the lateral cortex at the distal lateral supracondylar region medially and minimally distally to the  medial cortex at the medial epicondyle. On lateral view the fracture line is in proximal volar to distal dorsal orientation, extending to the level of the proximal aspect of the olecranon fossa. Mild-to-moderate radial  head-neck junction circumferential degenerative osteophytes. Mild-to-moderate osteophytosis of the tip of the coronoid process and medial aspect of the coronoid process. Right forearm: Likely old nonunited ulnar styloid fracture. 5 x 2 mm well corticated chronic ossicle at the dorsolateral aspect of the wrist joint. Moderate dorsal osteophytosis at the likely second carpometacarpal joint. IMPRESSION: 1. Acute nondisplaced fracture of the distal humerus. This fracture line extends from the lateral cortex at the distal lateral supracondylar region medially and minimally distally to the medial cortex at the medial epicondyle. 2. Small elbow joint effusion. 3. 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 4. Likely old nonunited ulnar styloid fracture. Electronically Signed   By: Neita Garnet M.D.   On: 10/17/2023 18:23   DG Forearm Right Result Date: 10/17/2023 CLINICAL DATA:  Fall onto right arm.  Pain. EXAM: RIGHT ELBOW - COMPLETE 3+ VIEW; RIGHT FOREARM - 2 VIEW COMPARISON:  None Available. FINDINGS: Right elbow: There is minimal elevation of the distal anterior humeral fat pad suggesting a small elbow joint effusion. Diffuse decreased bone mineralization. There is linear lucency indicating an acute fracture of the distal humerus. This fracture line extends from the lateral cortex at the distal lateral supracondylar region medially and minimally distally to the medial cortex at the medial epicondyle. On lateral view the fracture line is in proximal volar to distal dorsal orientation, extending to the level of the proximal aspect of the olecranon fossa. Mild-to-moderate radial head-neck junction circumferential degenerative osteophytes. Mild-to-moderate osteophytosis of the tip of the coronoid process and medial aspect of the coronoid process. Right forearm: Likely old nonunited ulnar styloid fracture. 5 x 2 mm well corticated chronic ossicle at the dorsolateral aspect of the wrist joint. Moderate dorsal osteophytosis at  the likely second carpometacarpal joint. IMPRESSION: 1. Acute nondisplaced fracture of the distal humerus. This fracture line extends from the lateral cortex at the distal lateral supracondylar region medially and minimally distally to the medial cortex at the medial epicondyle. 2. Small elbow joint effusion. 3. 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 4. Likely old nonunited ulnar styloid fracture. Electronically Signed   By: Neita Garnet M.D.   On: 10/17/2023 18:23    Procedures Procedures (including critical care time)  Medications Ordered in UC Medications  ketorolac (TORADOL) injection 30 mg (30 mg Intramuscular Given 10/17/23 1750)    Initial Impression / Assessment and Plan / UC Course  I have reviewed the triage vital signs and the nursing notes.  Pertinent labs & imaging results that were available during my care of the patient were reviewed by me and considered in my medical decision making (see chart for details).   78 y/o male presents for right elbow pain and swelling after fall that occurred today.  Patient slipped on mud and fell.  Also hit his head but denies loss of consciousness, headaches, dizziness, vomiting, confusion, gait or balance problems.  Not on any anticoagulants.  Vital stable and normal.  Overall well-appearing.  No acute distress.  He does have evidence of head injury which appears to be minor.  Small hematoma/contusion and swelling of mid parietal bone of.  Normal cranial nerve exam.  Chest clear.  Heart regular rate and rhythm.  Tenderness of the right lateral ribs the patient has known rib fracture from a couple weeks ago.  No increased discomfort from previous fall.  It is actually feeling a bit better.  Tenderness and swelling of the right elbow and forearm.  I did explain to patient that I do not have access to CT scan and cannot rule out intracranial bleed but I have very low suspicion for it however, if we do not proceed with imaging they will need to very  closely monitor condition.  Patient does not want to go to emergency department and would like to be evaluated for elbow injury.  Will obtain x-ray of right elbow and forearm to assess for possible fracture.  X-ray shows fracture of distal humerus.  Discussed this with patient and wife.  Long-arm posterior splint and sling provided.  Reviewed fracture care guidelines.  Discussed follow-up with orthopedics and information given for him to contact Ortho.  Sent Norco to pharmacy for severe pain.  Reviewed ED precautions for head injury.  Fact he has a mild head injury but thoroughly reviewed going to ED if severe headache, vision changes, dizziness, weakness, vomiting, confusion, difficulty speaking or walking.  Discussed this with wife who look after him closely.  Complicated fracture which may require surgery.  Final Clinical Impressions(s) / UC Diagnoses   Final diagnoses:  Right elbow pain  Closed bicondylar fracture of distal end of right humerus, initial encounter  Minor head injury, initial encounter     Discharge Instructions      -You have a significant fracture of your elbow.  Wear the splint and do not get it wet. - Try to follow-up with orthopedics in the next few days. - Do not use affected arm until cleared by orthopedics.  May contact one of the offices below or request referral through PCP. - Sent pain medicine for you to use as needed.  You have a condition requiring you to follow up with Orthopedics so please call one of the following office for appointment:   [facility] Address: [address] [place], [place] [address] Phone: [phone]  [facility] [address] [place], [place], [place] [address] Phone: [phone]  [facility] [address] [place], [place] [address] Phone: [phone]   *In regards to head injury, appears to be minor but very close monitoring.  If pain worsens, you feel dizzy, weak, have vomiting, difficulty walking, speaking,  numbness/tingling, weakness, changes in behavior, confusion please seek immediate reevaluation in the emergency department.     ED Prescriptions     Medication Sig Dispense Auth. Provider   HYDROcodone-acetaminophen (NORCO/VICODIN) 5-325 MG tablet Take 1 tablet by mouth every 6 (six) hours as needed for up to 5 days. 20 tablet Shirlee Latch, PA-C      I have reviewed the PDMP during this encounter.   Shirlee Latch, PA-C 10/17/23 1910

## 2023-10-17 NOTE — ED Triage Notes (Signed)
 Pt states that he fell and injured his right elbow. X1 day
